# Patient Record
Sex: Male | Born: 1935 | Race: Black or African American | Hispanic: No | Marital: Married
Health system: Southern US, Community
[De-identification: ages and names within clinical notes are randomized; demographics above are authoritative.]

## PROBLEM LIST (undated history)

## (undated) DIAGNOSIS — G20 Parkinson's disease: Secondary | ICD-10-CM

## (undated) DIAGNOSIS — R296 Repeated falls: Secondary | ICD-10-CM

## (undated) DIAGNOSIS — I10 Essential (primary) hypertension: Secondary | ICD-10-CM

## (undated) HISTORY — DX: Repeated falls: R29.6

## (undated) HISTORY — PX: TONSILLECTOMY: SUR1361

## (undated) HISTORY — PX: INGUINAL HERNIA REPAIR: SUR1180

## (undated) HISTORY — DX: Parkinson's disease: G20

## (undated) HISTORY — PX: APPENDECTOMY: SHX54

---

## 2008-04-25 ENCOUNTER — Emergency Department (HOSPITAL_COMMUNITY): Admission: EM | Admit: 2008-04-25 | Discharge: 2008-04-25 | Payer: Self-pay | Admitting: Family Medicine

## 2008-11-05 ENCOUNTER — Encounter: Admission: RE | Admit: 2008-11-05 | Discharge: 2008-11-05 | Payer: Self-pay | Admitting: Neurology

## 2008-11-13 ENCOUNTER — Ambulatory Visit (HOSPITAL_COMMUNITY): Admission: RE | Admit: 2008-11-13 | Discharge: 2008-11-13 | Payer: Self-pay | Admitting: Neurology

## 2009-02-11 ENCOUNTER — Encounter: Admission: RE | Admit: 2009-02-11 | Discharge: 2009-02-11 | Payer: Self-pay | Admitting: Neurosurgery

## 2009-05-26 ENCOUNTER — Emergency Department (HOSPITAL_COMMUNITY): Admission: EM | Admit: 2009-05-26 | Discharge: 2009-05-26 | Payer: Self-pay | Admitting: Emergency Medicine

## 2009-11-04 ENCOUNTER — Inpatient Hospital Stay (HOSPITAL_COMMUNITY): Admission: EM | Admit: 2009-11-04 | Discharge: 2009-11-11 | Payer: Self-pay | Admitting: Emergency Medicine

## 2010-09-15 ENCOUNTER — Emergency Department (HOSPITAL_COMMUNITY): Payer: Medicare Other

## 2010-09-15 ENCOUNTER — Inpatient Hospital Stay (HOSPITAL_COMMUNITY)
Admission: EM | Admit: 2010-09-15 | Discharge: 2010-09-18 | DRG: 372 | Disposition: A | Discharge status: Home / Self Care | Payer: Medicare Other | Attending: Internal Medicine | Admitting: Internal Medicine

## 2010-09-15 DIAGNOSIS — N179 Acute kidney failure, unspecified: Secondary | ICD-10-CM | POA: Diagnosis present

## 2010-09-15 DIAGNOSIS — E860 Dehydration: Secondary | ICD-10-CM | POA: Diagnosis present

## 2010-09-15 DIAGNOSIS — G20 Parkinson's disease: Secondary | ICD-10-CM | POA: Diagnosis present

## 2010-09-15 DIAGNOSIS — G20A1 Parkinson's disease without dyskinesia, without mention of fluctuations: Secondary | ICD-10-CM | POA: Diagnosis present

## 2010-09-15 DIAGNOSIS — A0472 Enterocolitis due to Clostridium difficile, not specified as recurrent: Principal | ICD-10-CM | POA: Diagnosis present

## 2010-09-15 DIAGNOSIS — I10 Essential (primary) hypertension: Secondary | ICD-10-CM | POA: Diagnosis present

## 2010-09-15 DIAGNOSIS — Z7982 Long term (current) use of aspirin: Secondary | ICD-10-CM

## 2010-09-15 DIAGNOSIS — N400 Enlarged prostate without lower urinary tract symptoms: Secondary | ICD-10-CM | POA: Diagnosis present

## 2010-09-15 LAB — BASIC METABOLIC PANEL WITH GFR
Chloride: 98 meq/L (ref 96–112)
Glucose, Bld: 167 mg/dL — ABNORMAL HIGH (ref 70–99)
Sodium: 135 meq/L (ref 135–145)

## 2010-09-15 LAB — BASIC METABOLIC PANEL
BUN: 57 mg/dL — ABNORMAL HIGH (ref 6–23)
CO2: 28 mEq/L (ref 19–32)
Calcium: 8.9 mg/dL (ref 8.4–10.5)
Creatinine, Ser: 2.52 mg/dL — ABNORMAL HIGH (ref 0.4–1.5)
GFR calc Af Amer: 30 mL/min — ABNORMAL LOW (ref >60)
GFR calc non Af Amer: 25 mL/min — ABNORMAL LOW (ref >60)
Potassium: 3.8 mEq/L (ref 3.5–5.1)

## 2010-09-15 LAB — DIFFERENTIAL
Basophils Absolute: 0 10*3/uL (ref 0.0–0.1)
Basophils Relative: 0 % (ref 0–1)
Eosinophils Absolute: 0 10*3/uL (ref 0.0–0.7)
Eosinophils Relative: 0 % (ref 0–5)
Lymphocytes Relative: 5 % — ABNORMAL LOW (ref 12–46)
Lymphs Abs: 1 K/uL (ref 0.7–4.0)
Monocytes Absolute: 1.4 10*3/uL — ABNORMAL HIGH (ref 0.1–1.0)
Monocytes Relative: 7 % (ref 3–12)
Neutro Abs: 17.8 K/uL — ABNORMAL HIGH (ref 1.7–7.7)
Neutrophils Relative %: 88 % — ABNORMAL HIGH (ref 43–77)

## 2010-09-15 LAB — CBC
HCT: 31.5 % — ABNORMAL LOW (ref 39.0–52.0)
Hemoglobin: 10.9 g/dL — ABNORMAL LOW (ref 13.0–17.0)
MCH: 31.1 pg (ref 26.0–34.0)
MCHC: 34.6 g/dL (ref 30.0–36.0)
MCV: 89.7 fL (ref 78.0–100.0)
Platelets: 172 10*3/uL (ref 150–400)
RBC: 3.51 MIL/uL — ABNORMAL LOW (ref 4.22–5.81)
RDW: 13.5 % (ref 11.5–15.5)
WBC: 20.2 10*3/uL — ABNORMAL HIGH (ref 4.0–10.5)

## 2010-09-15 LAB — URINE MICROSCOPIC-ADD ON

## 2010-09-15 LAB — URINALYSIS, ROUTINE W REFLEX MICROSCOPIC
Bilirubin Urine: NEGATIVE
Nitrite: NEGATIVE
Protein, ur: 30 mg/dL — AB
Urobilinogen, UA: 1 mg/dL (ref 0.0–1.0)

## 2010-09-16 ENCOUNTER — Inpatient Hospital Stay (HOSPITAL_COMMUNITY): Payer: Medicare Other

## 2010-09-16 LAB — DIFFERENTIAL
Lymphocytes Relative: 3 % — ABNORMAL LOW (ref 12–46)
Monocytes Absolute: 1.9 10*3/uL — ABNORMAL HIGH (ref 0.1–1.0)
Monocytes Relative: 12 % (ref 3–12)
Neutrophils Relative %: 85 % — ABNORMAL HIGH (ref 43–77)

## 2010-09-16 LAB — BASIC METABOLIC PANEL
CO2: 29 mEq/L (ref 19–32)
Chloride: 100 mEq/L (ref 96–112)
Creatinine, Ser: 2.02 mg/dL — ABNORMAL HIGH (ref 0.4–1.5)
Sodium: 137 mEq/L (ref 135–145)

## 2010-09-16 LAB — CBC
HCT: 29.6 % — ABNORMAL LOW (ref 39.0–52.0)
MCHC: 33.4 g/dL (ref 30.0–36.0)
MCV: 88.4 fL (ref 78.0–100.0)
Platelets: 170 10*3/uL (ref 150–400)
RBC: 3.35 MIL/uL — ABNORMAL LOW (ref 4.22–5.81)
RDW: 13.5 % (ref 11.5–15.5)

## 2010-09-17 LAB — BASIC METABOLIC PANEL
CO2: 25 mEq/L (ref 19–32)
Calcium: 8.3 mg/dL — ABNORMAL LOW (ref 8.4–10.5)
Chloride: 103 mEq/L (ref 96–112)
Creatinine, Ser: 1.95 mg/dL — ABNORMAL HIGH (ref 0.4–1.5)
Creatinine, Ser: 1.85 mg/dL — ABNORMAL HIGH (ref 0.4–1.5)
GFR calc Af Amer: 40 mL/min — ABNORMAL LOW (ref >60)
GFR calc Af Amer: 43 mL/min — ABNORMAL LOW (ref >60)
Potassium: 3.5 mEq/L (ref 3.5–5.1)
Potassium: 3.5 mEq/L (ref 3.5–5.1)
Sodium: 136 mEq/L (ref 135–145)

## 2010-09-17 LAB — CBC
HCT: 30 % — ABNORMAL LOW (ref 39.0–52.0)
HCT: 31.8 % — ABNORMAL LOW (ref 39.0–52.0)
MCH: 31 pg (ref 26.0–34.0)
MCHC: 35 g/dL (ref 30.0–36.0)
MCV: 88.5 fL (ref 78.0–100.0)
MCV: 87.8 fL (ref 78.0–100.0)
Platelets: 169 10*3/uL (ref 150–400)
RBC: 3.62 MIL/uL — ABNORMAL LOW (ref 4.22–5.81)
RDW: 13.4 % (ref 11.5–15.5)
WBC: 12.6 10*3/uL — ABNORMAL HIGH (ref 4.0–10.5)

## 2010-09-19 LAB — STOOL CULTURE

## 2010-09-22 NOTE — H&P (Signed)
NAME:  Kevin Hendrix, Kevin Hendrix NO.:  [id number]  MEDICAL RECORD NO.:  [id number]           PATIENT TYPE:  E  LOCATION:  [facility]                         FACILITY:  [facility]  PHYSICIAN:  Erick Blinks, MD     DATE OF BIRTH:  1929-04-19  DATE OF ADMISSION:  09/15/2010 DATE OF DISCHARGE:                             HISTORY & PHYSICAL   PRIMARY CARE PHYSICIAN:  Candyce Churn, MD  CHIEF COMPLAINT:  Abdominal pain and diarrhea.  HISTORY OF PRESENT ILLNESS:  This is a 77 year old gentleman with history of hypertension and Parkinson disease, who presents to the emergency room with complaints of diarrhea.  The patient reports having diarrhea for the past 1 week or so.  He did have a fever of 101 which was reported on Friday and since then has not had any significant fever. He was having 4-5 bowel movements a day per his family and since then and it has somewhat improved.  He did not have any nausea or vomiting. He had some abdominal pain on palpation but otherwise does not have any pain.  He denies any dysuria or any shortness of breath.  His family reports that he does occasionally choke when he eats and has a cough with that, but no significant cough otherwise.  No headache, changes in vision, unilateral weakness or numbness.  He denies any chest pain or any other complaints.  In the ER, the patient had a CT scan done of his abdomen which was consistent with colitis.  He was also found to have a leukocytosis of 20,000 and acute renal failure.  He has been preferred for admission.  PAST MEDICAL HISTORY: 1. Hypertension. 2. Parkinson disease. 3. Benign prostatic hypertrophy.  ALLERGIES:  No known drug allergies.  MEDICATIONS:  Prior to admission. 1. Finasteride 5 mg daily. 2. Maxzide 37.5/25 mg daily. 3. Aspirin 81 mg daily. 4. Sinemet 25/250 mg half a tablet q.i.d.  SOCIAL HISTORY:  The patient is a nonsmoker, nondrinker.  Denies any drug use.  FAMILY HISTORY:   Hypertension and diabetes runs in his family.  REVIEW OF SYSTEMS:  As per HPI.  PHYSICAL EXAMINATION:  VITAL SIGNS:  Temperature of 98.9, respiratory rate of 20, heart rate of 83, blood pressure 132/57, oxygen saturation 98% on room air. GENERAL:  The patient is in no acute distress, lying in bed. HEENT:  Normocephalic, atraumatic.  Pupils are equal, round, reactive to light. NECK:  Supple. CHEST:  Clear to auscultation bilaterally. CARDIAC:  S1, S2 with a regular rate and rhythm. ABDOMEN:  Soft.  Mildly tender in the left lower quadrant.  Bowel sounds are active. EXTREMITIES:  No cyanosis, clubbing, or edema. NEUROLOGIC:  The patient is moving all of his extremities spontaneously. Strength is equal bilaterally.  Cranial nerves II-XII appeared grossly intact.  LABORATORY DATA:  Sodium 135, potassium 3.8, chloride 98, bicarb 28, glucose 167, BUN 57, creatinine 2.52, calcium 8.9, WBC 20.2, hemoglobin of 10.9, platelet count of 172.  Urinalysis shows 0-2 wbc's, negative leukocyte esterase, and negative nitrites.  CT of the abdomen and pelvis shows marked abnormality of the colon consistent with diffuse colitis. Differential diagnosis includes  infectious inflammatory cause, ischemic colitis will be less likely given the distribution, numerous hepatic and left renal cysts, and reduction and repair of bilateral inguinal hernias.  ASSESSMENT AND PLAN: 1. We will admit the patient to Dr. Kevan Ny' service.  He will be     admitted to a regular med/surg bed.  He will receive ciprofloxacin     and Flagyl for his colitis.  We will rehydrate with IV fluids.  We     will check stool for C. diff PCR as well as stool culture and ova,     parasites.  The patient will be given IV fluids.  We will recheck     his renal function in the morning.  If there is no significant     improvement, we will consider a renal ultrasound at that time.  The     patient will be placed on a heart healthy diet and  further orders     will be per the clinical course.  ADMISSION DIAGNOSES: 1. Acute colitis, presumed infectious 2. Acute renal failure. 3. Dehydration. 4. Diarrhea. 5. Hypertension. 6. Parkinson's. Further orders will be per the clinical course.     Erick Blinks, MD     JM/MEDQ  D:  09/15/2010  T:  09/15/2010  Job:  [id number]  cc:   Candyce Churn, M.D.  Electronically Signed by Erick Blinks  on 09/22/2010 10:15:52 PM

## 2010-09-24 NOTE — Discharge Summary (Signed)
  NAME:  Kevin Hendrix, Kevin Hendrix           ACCOUNT NO.:  [id number]  MEDICAL RECORD NO.:  [id number]           PATIENT TYPE:  I  LOCATION:  6733                         FACILITY:  [facility]  PHYSICIAN:  Candyce Churn, M.D.DATE OF BIRTH:  1929/01/28  DATE OF ADMISSION:  09/15/2010 DATE OF DISCHARGE:  09/18/2010                              DISCHARGE SUMMARY   DISCHARGE DIAGNOSES: 1. Clostridium difficile colitis, improving. 2. Parkinson disease. 3. Hypertension. 4. History of benign prostatic hypertrophy.  ALLERGIES:  No known drug allergies.  DISCHARGE MEDICATIONS: 1. Finasteride 5 mg daily. 2. Maxzide 37.5/25 mg daily. 3. Aspirin 81 mg daily. 4. Sinemet 25/250 one-half tablet q.i.d. 5. Flagyl 500 mg t.i.d. for 10 days.  DISCHARGE LABORATORIES:  Sodium 136, potassium 3.5, chloride 103, bicarb 25, glucose 140, BUN 44, creatinine 1.85, calcium 8.2.  White count 12,600, hemoglobin 11.1, and platelet count 173,000 on September 17, 2010.  CBC on admission revealed a white count of 20,200, hemoglobin 10.9, and platelet count 172,000 with 88% neutrophils.  HOSPITAL COURSE:  [name] is a very pleasant 77 year old male with hypertension and Parkinson disease who presented to the emergency room complaining of diarrhea and has had it for about a week. He did have a fever of 101 degrees on 1-2 days prior to admission.  He was having 4-5 bowel movements a day.  Apparently, he had taken some antibiotic therapy for dental work in the last several weeks.  The patient was admitted and stools were cultured and C. diff was positive and he was switched from IV Cipro and Flagyl to p.o. Flagyl on September 16, 2010.  The patient has tolerated these medications well.  Still slightly febrile on September 17, 2010, and plans are for discharge September 18, 2010.  Radiographic procedures while admitted included a CT of the abdomen and pelvis on September 15, 2010, revealing marked abnormality of the  colon consistent with diffuse colitis.  There were numerous hepatic and left renal cyst.  The patient had internal reduction and repair of bilateral inguinal hernias since his prior CT scanning in April 2011.  Chest x-ray from September 16, 2010, revealed old right rib fractures and degenerative changes of the right shoulder, but no active cardiopulmonary disease.  HOSPITAL COURSE:  The patient was improved markedly with introduction of antibiotics and by the time of discharge was able to ambulate with a walker and we will plan for home PT/OT and continued oral Flagyl as an outpatient.  We will plan to see back in the office in 2 weeks for followup and p.r.n.  CONDITION ON DISCHARGE:  Improved.  I should mention that his creatinine on admission was 2.52 and BUN 57 and on September 17, 2010, creatinine was down to 1.85 and BUN was 44.  Time spent reviewing chart, examining the patient, and performing dictation was 35 minutes.     Candyce Churn, M.D.     RNG/MEDQ  D:  09/17/2010  T:  09/18/2010  Job:  [id number]  Electronically Signed by Marden Noble M.D. on 09/24/2010 03:37:30 PM

## 2010-09-29 LAB — CBC
HCT: 35.8 % — ABNORMAL LOW (ref 39.0–52.0)
HCT: 41.3 % (ref 39.0–52.0)
Hemoglobin: 11.4 g/dL — ABNORMAL LOW (ref 13.0–17.0)
Hemoglobin: 12.3 g/dL — ABNORMAL LOW (ref 13.0–17.0)
MCHC: 34.3 g/dL (ref 30.0–36.0)
MCV: 96.6 fL (ref 78.0–100.0)
MCV: 97.3 fL (ref 78.0–100.0)
RBC: 3.7 MIL/uL — ABNORMAL LOW (ref 4.22–5.81)
RBC: 4.24 MIL/uL (ref 4.22–5.81)
RDW: 13.5 % (ref 11.5–15.5)
WBC: 4.3 10*3/uL (ref 4.0–10.5)
WBC: 8.4 10*3/uL (ref 4.0–10.5)

## 2010-09-29 LAB — DIFFERENTIAL
Basophils Absolute: 0 10*3/uL (ref 0.0–0.1)
Basophils Absolute: 0 10*3/uL (ref 0.0–0.1)
Lymphocytes Relative: 13 % (ref 12–46)
Lymphocytes Relative: 6 % — ABNORMAL LOW (ref 12–46)
Monocytes Absolute: 0.8 10*3/uL (ref 0.1–1.0)
Neutro Abs: 4.7 10*3/uL (ref 1.7–7.7)
Neutro Abs: 7.2 10*3/uL (ref 1.7–7.7)
Neutrophils Relative %: 86 % — ABNORMAL HIGH (ref 43–77)

## 2010-09-29 LAB — COMPREHENSIVE METABOLIC PANEL
BUN: 39 mg/dL — ABNORMAL HIGH (ref 6–23)
CO2: 34 mEq/L — ABNORMAL HIGH (ref 19–32)
Chloride: 103 mEq/L (ref 96–112)
Creatinine, Ser: 1.81 mg/dL — ABNORMAL HIGH (ref 0.4–1.5)
GFR calc non Af Amer: 36 mL/min — ABNORMAL LOW (ref >60)
Total Bilirubin: 1.2 mg/dL (ref 0.3–1.2)

## 2010-09-29 LAB — URINE CULTURE

## 2010-09-29 LAB — BASIC METABOLIC PANEL
BUN: 20 mg/dL (ref 6–23)
Calcium: 8.3 mg/dL — ABNORMAL LOW (ref 8.4–10.5)
Chloride: 105 mEq/L (ref 96–112)
GFR calc Af Amer: 53 mL/min — ABNORMAL LOW (ref >60)
GFR calc Af Amer: >60 mL/min (ref >60)
GFR calc non Af Amer: 44 mL/min — ABNORMAL LOW (ref >60)
Glucose, Bld: 113 mg/dL — ABNORMAL HIGH (ref 70–99)
Potassium: 3.8 mEq/L (ref 3.5–5.1)
Sodium: 141 mEq/L (ref 135–145)

## 2010-09-29 LAB — URINE MICROSCOPIC-ADD ON

## 2010-09-29 LAB — URINALYSIS, ROUTINE W REFLEX MICROSCOPIC
Nitrite: NEGATIVE
Specific Gravity, Urine: 1.028 (ref 1.005–1.030)
Urobilinogen, UA: 1 mg/dL (ref 0.0–1.0)

## 2010-09-29 LAB — LIPASE, BLOOD: Lipase: 23 U/L (ref 11–59)

## 2010-09-29 LAB — HEMOCCULT GUIAC POC 1CARD (OFFICE): Fecal Occult Bld: POSITIVE

## 2010-10-01 ENCOUNTER — Other Ambulatory Visit (HOSPITAL_COMMUNITY): Payer: Self-pay | Admitting: Internal Medicine

## 2010-10-06 ENCOUNTER — Ambulatory Visit (HOSPITAL_COMMUNITY)
Admission: RE | Admit: 2010-10-06 | Discharge: 2010-10-06 | Disposition: A | Discharge status: Home / Self Care | Payer: Medicare Other | Source: Ambulatory Visit | Attending: Internal Medicine | Admitting: Internal Medicine

## 2010-10-06 DIAGNOSIS — R1310 Dysphagia, unspecified: Secondary | ICD-10-CM | POA: Insufficient documentation

## 2011-02-22 ENCOUNTER — Other Ambulatory Visit: Payer: Self-pay | Admitting: Neurology

## 2011-02-22 DIAGNOSIS — D320 Benign neoplasm of cerebral meninges: Secondary | ICD-10-CM

## 2011-02-22 DIAGNOSIS — G20 Parkinson's disease: Secondary | ICD-10-CM

## 2011-02-22 DIAGNOSIS — R269 Unspecified abnormalities of gait and mobility: Secondary | ICD-10-CM

## 2011-03-01 ENCOUNTER — Other Ambulatory Visit: Payer: Medicare Other

## 2011-03-31 ENCOUNTER — Ambulatory Visit
Admission: RE | Admit: 2011-03-31 | Discharge: 2011-03-31 | Disposition: A | Discharge status: Home / Self Care | Payer: Medicare Other | Source: Ambulatory Visit | Attending: Neurology | Admitting: Neurology

## 2011-03-31 DIAGNOSIS — D320 Benign neoplasm of cerebral meninges: Secondary | ICD-10-CM

## 2011-03-31 DIAGNOSIS — G20 Parkinson's disease: Secondary | ICD-10-CM

## 2011-03-31 DIAGNOSIS — R269 Unspecified abnormalities of gait and mobility: Secondary | ICD-10-CM

## 2011-03-31 MED ORDER — GADOBENATE DIMEGLUMINE 529 MG/ML IV SOLN
13.0000 mL | Freq: Once | INTRAVENOUS | Status: AC | PRN
  Administered 2011-03-31: 13 mL via INTRAVENOUS

## 2011-09-27 ENCOUNTER — Emergency Department (HOSPITAL_COMMUNITY)
Admission: EM | Admit: 2011-09-27 | Discharge: 2011-09-27 | Disposition: A | Discharge status: Home / Self Care | Payer: Medicare Other | Attending: Emergency Medicine | Admitting: Emergency Medicine

## 2011-09-27 ENCOUNTER — Encounter (HOSPITAL_COMMUNITY): Payer: Self-pay | Admitting: Emergency Medicine

## 2011-09-27 DIAGNOSIS — I10 Essential (primary) hypertension: Secondary | ICD-10-CM | POA: Insufficient documentation

## 2011-09-27 DIAGNOSIS — G20 Parkinson's disease: Secondary | ICD-10-CM | POA: Insufficient documentation

## 2011-09-27 DIAGNOSIS — Z0000 Encounter for general adult medical examination without abnormal findings: Secondary | ICD-10-CM

## 2011-09-27 DIAGNOSIS — G20A1 Parkinson's disease without dyskinesia, without mention of fluctuations: Secondary | ICD-10-CM | POA: Insufficient documentation

## 2011-09-27 HISTORY — DX: Parkinson's disease: G20

## 2011-09-27 HISTORY — DX: Essential (primary) hypertension: I10

## 2011-09-27 NOTE — ED Provider Notes (Signed)
Medical screening examination/treatment/procedure(s) were conducted as a shared visit with non-physician practitioner(s) and myself.  I personally evaluated the patient during the encounter.  Blood pressures normalized. No chest pain or shortness of breath  Donnetta Hutching, MD 09/27/11 2353

## 2011-09-27 NOTE — ED Provider Notes (Signed)
History     CSN: [id number]  Arrival date & time 09/27/11  2027   First MD Initiated Contact with Patient 09/27/11 2214      Chief Complaint  Patient presents with  . Hypertension    (Consider location/radiation/quality/duration/timing/severity/associated sxs/prior treatment) HPI Comments: She states she feels fine has no visual disturbances, headache, nausea, vomiting, diarrhea, diaphoresis, chest pain, leg swelling.  His caregiver states that they took his blood pressure, and it was elevated in both arms.  He, states she's been taking his medication on a regular basis.  On arrival to the emergency room his blood pressure is slightly elevated, but well within normal limits.  Patient is a 78 y.o. male presenting with hypertension. The history is provided by the patient.  Hypertension The current episode started today. Pertinent negatives include no chest pain, congestion, fatigue, headaches or weakness.    Past Medical History  Diagnosis Date  . Hypertension   . Parkinson disease     Past Surgical History  Procedure Date  . Inguinal hernia repair   . Appendectomy   . Tonsillectomy     History reviewed. No pertinent family history.  History  Substance Use Topics  . Smoking status: Former Smoker    Quit date: 09/27/1974  . Smokeless tobacco: Not on file  . Alcohol Use: No      Review of Systems  Constitutional: Negative for fatigue.  HENT: Negative for congestion.   Eyes: Negative for visual disturbance.  Respiratory: Negative for shortness of breath.   Cardiovascular: Negative for chest pain.  Neurological: Negative for dizziness, weakness and headaches.  Psychiatric/Behavioral: Negative for confusion.    Allergies  Review of patient's allergies indicates no known allergies.  Home Medications   Current Outpatient Rx  Name Route Sig Dispense Refill  . CARBIDOPA-LEVODOPA 25-100 MG PO TABS Oral Take 0.5 tablets by mouth 4 (four) times daily. Pt takes this  medication at 8 am, 11 am, 4 pm, 7 pm    . FINASTERIDE 5 MG PO TABS Oral Take 5 mg by mouth daily.    Marland Kitchen OLMESARTAN MEDOXOMIL 20 MG PO TABS Oral Take 20 mg by mouth daily.      BP 141/67  Pulse 76  Temp(Src) 98.7 F (37.1 C) (Oral)  Resp 17  SpO2 96%  Physical Exam  Constitutional: He appears well-developed and well-nourished.  HENT:  Head: Normocephalic.  Neck: Normal range of motion.  Pulmonary/Chest: Effort normal.  Musculoskeletal: Normal range of motion.  Neurological: He is alert.  Skin: Skin is warm. No rash noted. No pallor.    ED Course  Procedures (including critical care time)  Labs Reviewed - No data to display No results found.   1. Normal physical exam       MDM   I've encouraged the patient to bring his home blood pressure cuff with his next doctor appointment to verify its accuracy as his blood pressure here was well within normal range and he is asymptomatic        Arman Filter, NP 09/27/11 2300

## 2011-09-27 NOTE — Discharge Instructions (Signed)
Normal Exam, Adult You were seen and examined today in our facility. Our caregiver found nothing wrong on the exam. If testing was done such as lab work or x-rays, they did not indicate enough wrong to suggest that treatment should be given. The caregiver then must decide after testing is finished if your concern is a physical problem or illness that needs treatment. Today no treatable problem was found. Even if reassurance was given, if you feel you are getting worse when you get home make sure you call back or return to our department. For the protection of your privacy, test results can not be given over the phone. Make sure you receive the results of your test. Ask as to how these results are to be obtained if you have not been informed. It is your responsibility to obtain your test results. Your condition can change over time. Sometimes it takes more than one visit to determine the cause of your symptoms. It is important that you monitor your condition for any changes. SEEK IMMEDIATE MEDICAL CARE IF:  You develop an oral temperature above 102 F (38.9 C), which lasts for more than 2 days, not controlled by medications. Only take over-the-counter or prescription medicines for pain, discomfort, or fever as directed by your caregiver.   You develop a loss of appetite or start throwing up (vomiting).   You develop a rash, cough, belly (abdominal) pain, earache, headache, or develop pain in neck, muscles, or joints.   The problem or problems which brought you to our facility become worse or are a cause of more concern.  If we have told you today your exam and tests are normal, and a short while later you feel this is not right, please seek medical attention so you may be rechecked. Document Released: 10/10/2000 Document Revised: 06/17/2011 Document Reviewed: 02/02/2008 [facility] Patient Information 2012 [place], [place]. Please make sure to bring a blood pressure cuff with you on your next visit to  see Dr. Kevan Ny to verify its accuracy

## 2011-09-27 NOTE — ED Notes (Signed)
Pt to ED for eval of htn, pt checked BP at home 203/92 in R arm and L arm 190/91 at 1200 then rechecked it at 1835 and r arm was 207/95 and l arm 205/91 called MD Dr Debby Bud and was told to come to ED; pt denies pain, lightheadedness, blurred/double vision; denies n/v; pt in NAD, A&OX4

## 2011-09-27 NOTE — ED Notes (Signed)
Pt seen and dispositioned by EDNP prior to RN assessment, see NP notes, orders received and initiated, pt denies sx, (denies: needs, sx, questions or concerns unmet), "ready to go", family present.

## 2012-03-23 ENCOUNTER — Other Ambulatory Visit: Payer: Self-pay | Admitting: Neurology

## 2012-03-23 DIAGNOSIS — D329 Benign neoplasm of meninges, unspecified: Secondary | ICD-10-CM

## 2012-03-30 ENCOUNTER — Other Ambulatory Visit: Payer: Medicare Other

## 2012-04-04 ENCOUNTER — Ambulatory Visit
Admission: RE | Admit: 2012-04-04 | Discharge: 2012-04-04 | Disposition: A | Discharge status: Home / Self Care | Payer: Medicare Other | Source: Ambulatory Visit | Attending: Neurology | Admitting: Neurology

## 2012-04-04 DIAGNOSIS — D329 Benign neoplasm of meninges, unspecified: Secondary | ICD-10-CM

## 2012-04-04 MED ORDER — GADOBENATE DIMEGLUMINE 529 MG/ML IV SOLN
14.0000 mL | Freq: Once | INTRAVENOUS | Status: AC | PRN
  Administered 2012-04-04: 14 mL via INTRAVENOUS

## 2012-07-19 IMAGING — CT CT ABD-PELV W/O CM
2 of 4 series · 17 of 46 positions shown, 19 images · non-contrast
Comparison: 11/04/2009

CLINICAL DATA: Abdominal and pelvic pain.  Persistent diarrhea.
Low grade fever.  History hypertension, Parkinson's, appendectomy,
laparotomy, hernia repair.  Elevated creatinine.

CT ABDOMEN AND PELVIS WITHOUT CONTRAST
TECHNIQUE: Multidetector CT imaging of the abdomen and pelvis was
performed following the standard protocol without intravenous
contrast.

[Series 2: abd/pelv w/o 5.0 b31f st · axial · non-contrast · 0.68mm/px · z∈[+885,+1245]mm · 14 of 79 slices shown, 16 images]
[im 4/79  soft-tissue]
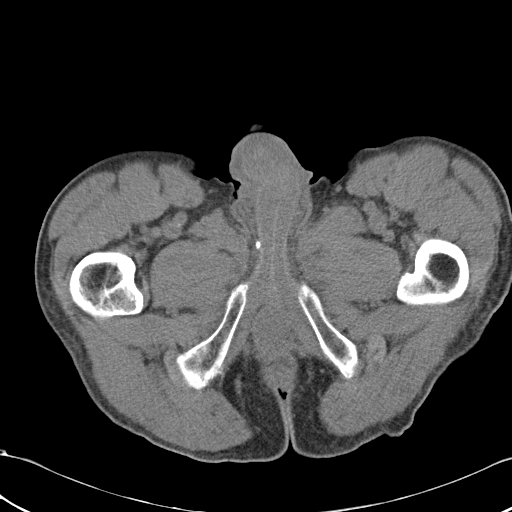
[im 4/79  bone]
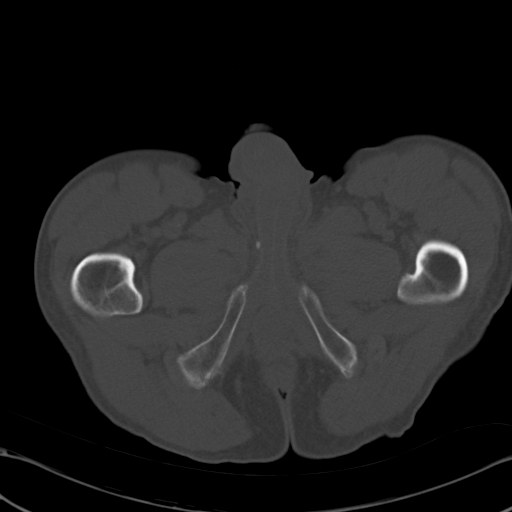
[im 10/79  soft-tissue]
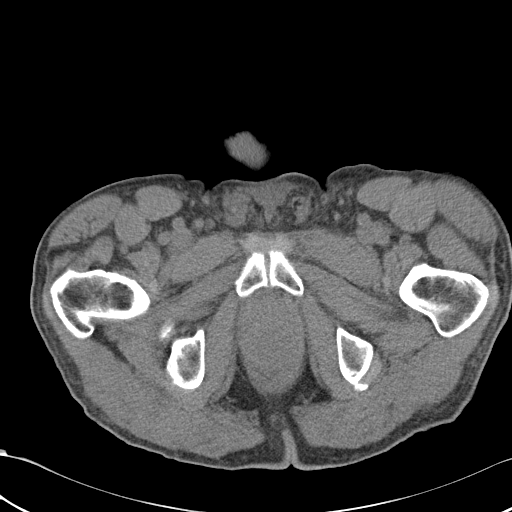
[im 16/79  soft-tissue]
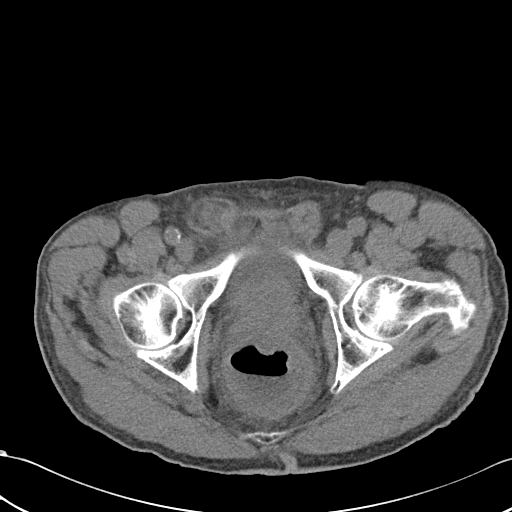
[im 22/79  soft-tissue]
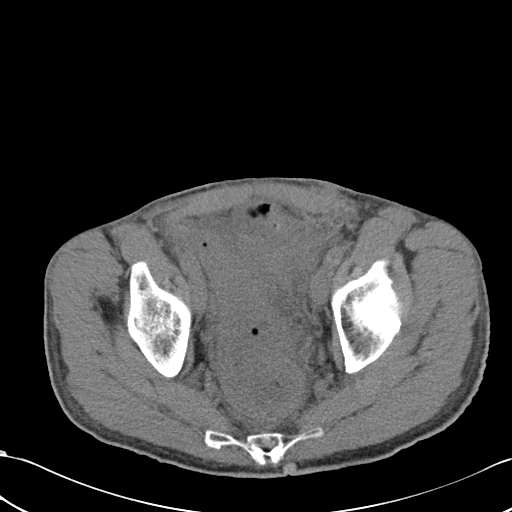
[im 28/79  soft-tissue]
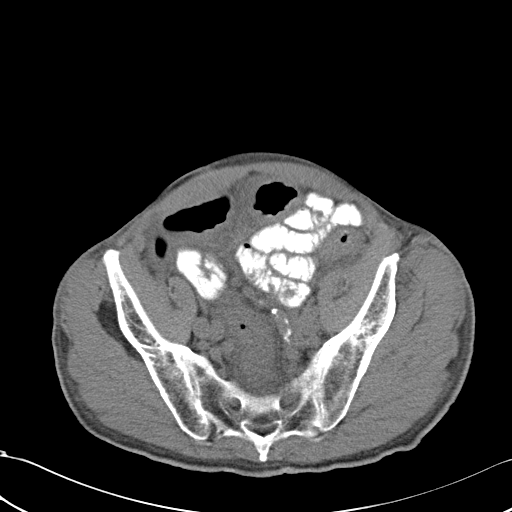
[im 31/79  soft-tissue]
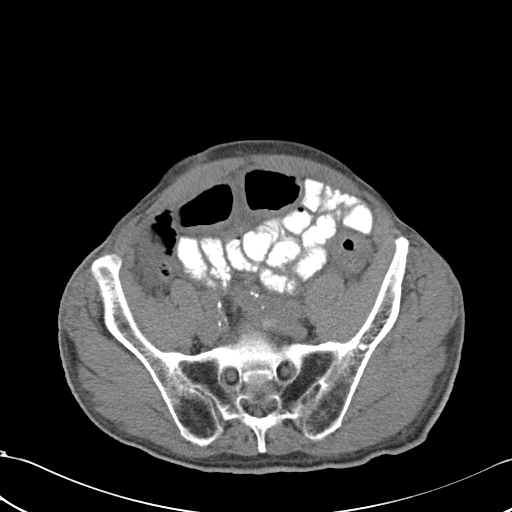
[im 37/79  soft-tissue]
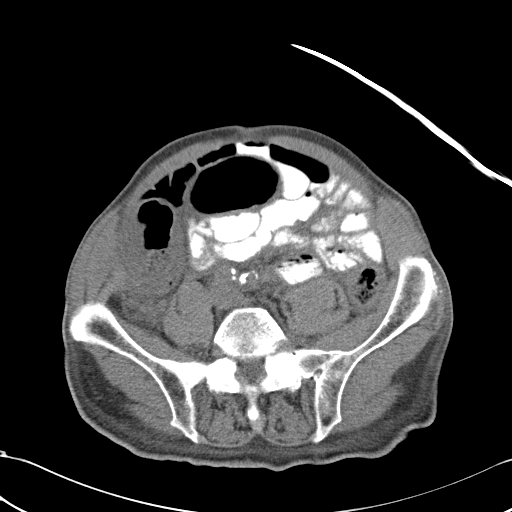
[im 43/79  soft-tissue]
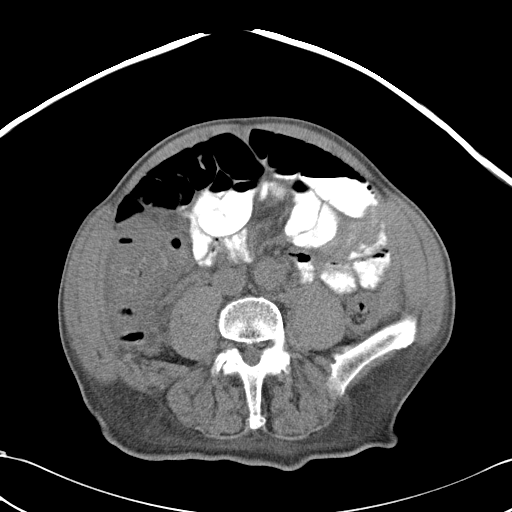
[im 49/79  soft-tissue]
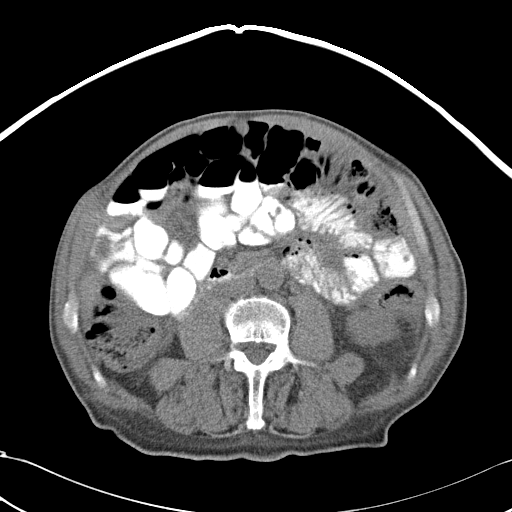
[im 49/79  bone]
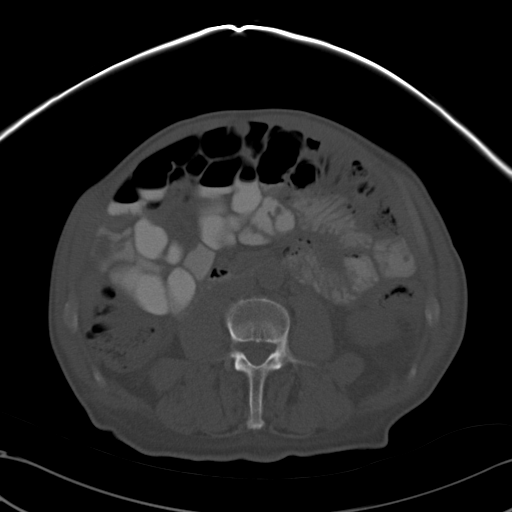
[im 52/79  soft-tissue]
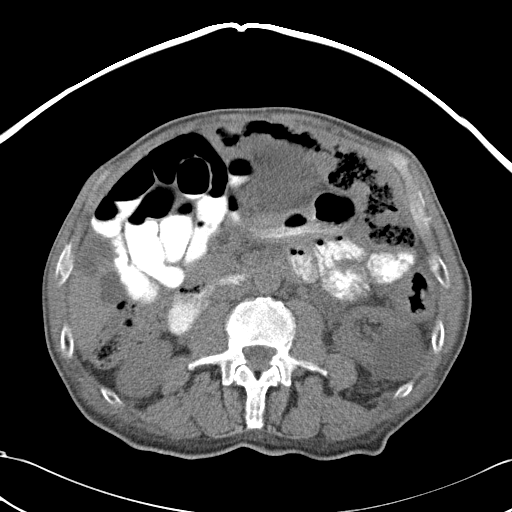
[im 58/79  soft-tissue]
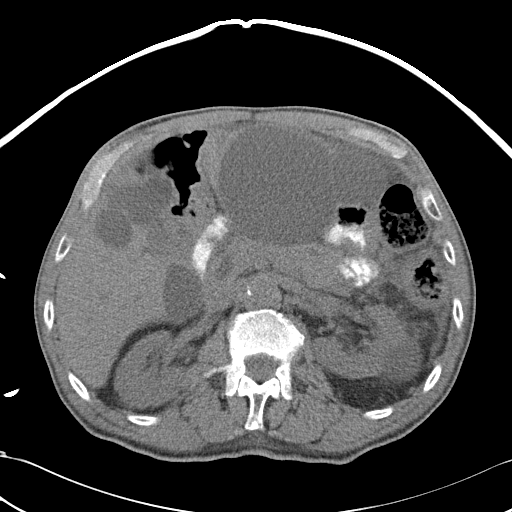
[im 64/79  soft-tissue]
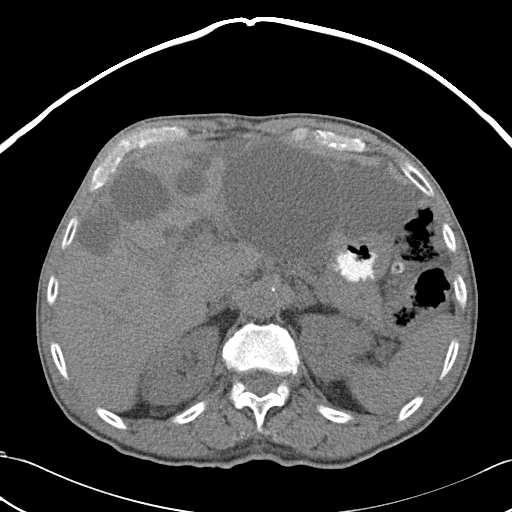
[im 70/79  soft-tissue]
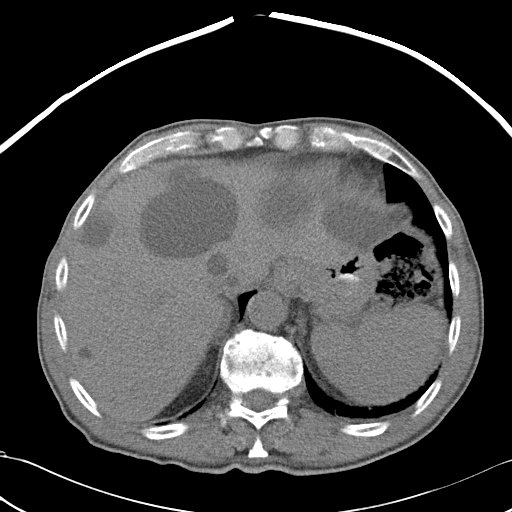
[im 76/79  soft-tissue]
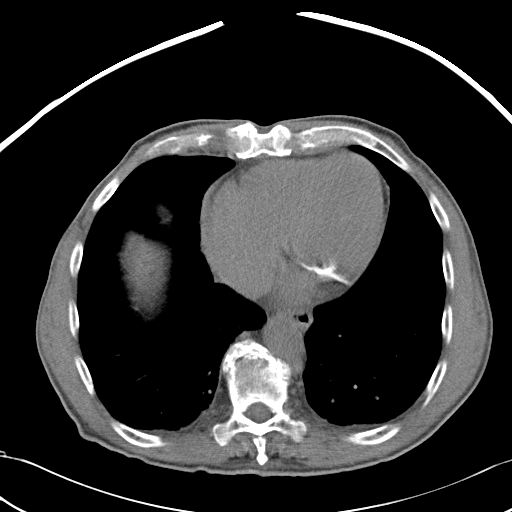

[Series 5: abd/pelv w/o 2.0 spo cor thins · coronal · non-contrast · 1.04mm/px · 3 of 108 slices shown]
[im 36/108  soft-tissue]
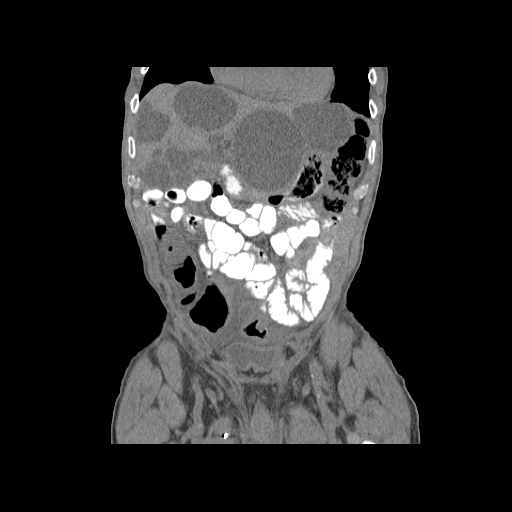
[im 48/108  soft-tissue]
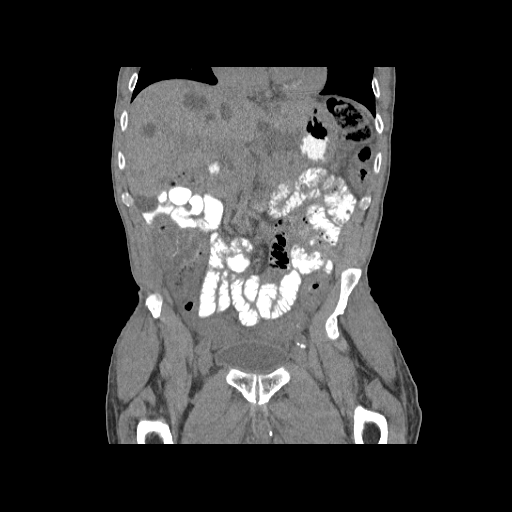
[im 60/108  soft-tissue]
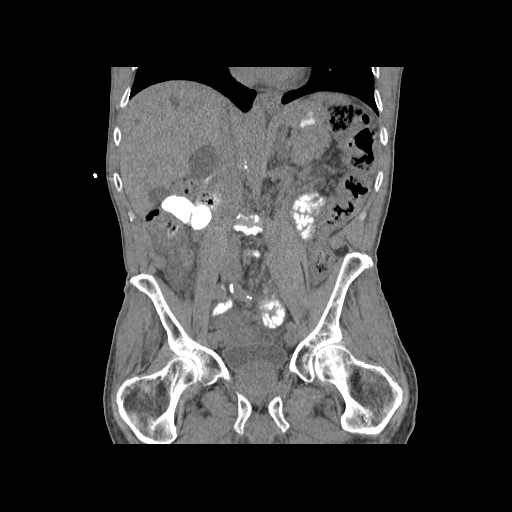

[17 of 46 positions shown; findings below may reference images not displayed]

FINDINGS: Again noted are numerous large and small cysts throughout
the liver.  The largest is within the left hepatic lobe, measuring
8.0 x 9.0 cm.  Left renal cyst is 4.5 cm.  No focal abnormality
identified within the spleen, pancreas, or right kidney.

Small bowel loops appear normal in caliber.  There is marked
abnormality of large bowel loops.  This appears involve the entire
colon.  The wall appears thickened, estimated to measure at least
1.4 cm in the region of the sigmoid colon.  No evidence for free
intraperitoneal air or abscess.  There is no free pelvic fluid. No
evidence for aortic aneurysm.  No retroperitoneal or mesenteric
adenopathy.  Inguinal hernias have been repaired or reduced.  There
are degenerative changes throughout the lower thoracic and lumbar
spine.
IMPRESSION: 1.  Marked abnormality of the colon, consistent with diffuse
colitis.  Differential diagnosis includes infectious, inflammatory
causes.  Ischemic colitis would be less likely given the
distribution.
2.  Numerous hepatic and left renal cysts.
3.  Interval reduction or repair of bilateral inguinal hernias.

## 2013-01-01 ENCOUNTER — Encounter: Payer: Self-pay | Admitting: Neurology

## 2013-02-23 ENCOUNTER — Encounter: Payer: Self-pay | Admitting: Neurology

## 2013-02-23 ENCOUNTER — Telehealth: Payer: Self-pay | Admitting: Neurology

## 2013-02-23 NOTE — Telephone Encounter (Signed)
Called patient and confirmed sooner appt.

## 2013-02-23 NOTE — Telephone Encounter (Signed)
Call and ask for Kevin Hendrix, pt had an apt today but didn't come till 2:45 thought that was his apt but it was at 11:30 needs to get in soon per Kevin Hendrix due to pt is doing a lot of falling. Thanks.

## 2013-03-01 ENCOUNTER — Ambulatory Visit (INDEPENDENT_AMBULATORY_CARE_PROVIDER_SITE_OTHER): Payer: PRIVATE HEALTH INSURANCE | Admitting: Neurology

## 2013-03-01 ENCOUNTER — Encounter: Payer: Self-pay | Admitting: Neurology

## 2013-03-01 VITALS — BP 164/77 | HR 65 | Temp 98.7°F | Ht 67.5 in | Wt 142.5 lb

## 2013-03-01 DIAGNOSIS — G20A1 Parkinson's disease without dyskinesia, without mention of fluctuations: Secondary | ICD-10-CM

## 2013-03-01 DIAGNOSIS — R296 Repeated falls: Secondary | ICD-10-CM

## 2013-03-01 DIAGNOSIS — R269 Unspecified abnormalities of gait and mobility: Secondary | ICD-10-CM

## 2013-03-01 DIAGNOSIS — D320 Benign neoplasm of cerebral meninges: Secondary | ICD-10-CM

## 2013-03-01 DIAGNOSIS — Z9181 History of falling: Secondary | ICD-10-CM

## 2013-03-01 DIAGNOSIS — D329 Benign neoplasm of meninges, unspecified: Secondary | ICD-10-CM

## 2013-03-01 DIAGNOSIS — R413 Other amnesia: Secondary | ICD-10-CM

## 2013-03-01 DIAGNOSIS — G20 Parkinson's disease: Secondary | ICD-10-CM

## 2013-03-01 HISTORY — DX: Parkinson's disease: G20

## 2013-03-01 HISTORY — DX: Repeated falls: R29.6

## 2013-03-01 NOTE — Patient Instructions (Addendum)
I think overall you are doing fairly well and are stable at this point.   I do have some generic suggestions for you today:  Please make sure that you drink plenty of fluids. I would like for you to exercise daily for example in the form of walking 20-30 minutes every day, if you can. Please keep a regular sleep-wake schedule, keep regular meal times, do not skip any meals, eat  healthy snacks in between meals, such as fruit or nuts. Try to eat protein with every meal.   As far as your medications are concerned, I would like to suggest: no changes.   Engage in social activities in your community and with your family and try to keep up with current events by reading the newspaper or watching the news.  I do not think we need to make any changes in your medications at this point. I would like for you to use your 4 pronged cane at all times and sure 2 wheeled walker. Try to change positions slowly and always try to get her bearings before you start walking. I would not recommend that you use your single-point cane at this moment. As a reminder, we should probably consider doing another brain MRI a year from now. Your brain scans have been stable since 2010 which is a good sign. If you have sudden increase in lethargy or headache we have to recheck your brain tumor sooner. If you have another seizure-like episode he need to go to the emergency room or call 911.   Please remember, common seizure triggers are: Sleep deprivation, dehydration, overheating, stress, hypoglycemia or skipping meals, certain medications or excessive alcohol use, especially stopping alcohol abruptly if you have had heavy alcohol use before (aka alcohol withdrawal seizure). If you have a prolonged seizure over 2-5 minutes or back to back seizures, call or have someone call 911 or take you to the nearest emergency room. You cannot drive a car. Please do not swim alone or take a tub bath for safety. Do not cook with large quantities of  boiling water or oil for safety. Take your medicine for seizure prevention regularly and do not skip doses or stop medication abruptly and tone are told to do so by your healthcare provider. Try to get a refill on your antiepileptic medication ahead of time, so you are not at risk of running out. If you run out of the seizure medication and do not have a refill at hand she may run into medication withdrawal seizures.  Brett Canales is my clinical assistant and will answer any of your questions and relay your messages to me and will give you my messages.   Our phone number is [phone]. We also have an after hours call service for urgent matters and there is a physician on-call for urgent questions. For any emergencies you know to call 911 or go to the nearest emergency room.
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
## 2013-03-07 ENCOUNTER — Encounter (HOSPITAL_COMMUNITY): Payer: Self-pay | Admitting: Emergency Medicine

## 2013-03-07 ENCOUNTER — Emergency Department (HOSPITAL_COMMUNITY): Payer: Medicare Other

## 2013-03-07 ENCOUNTER — Other Ambulatory Visit: Payer: Self-pay

## 2013-03-07 ENCOUNTER — Inpatient Hospital Stay (HOSPITAL_COMMUNITY)
Admission: EM | Admit: 2013-03-07 | Discharge: 2013-03-14 | DRG: 481 | Disposition: A | Discharge status: Skilled Nursing Facility | Payer: Medicare Other | Attending: Internal Medicine | Admitting: Internal Medicine

## 2013-03-07 DIAGNOSIS — Y92009 Unspecified place in unspecified non-institutional (private) residence as the place of occurrence of the external cause: Secondary | ICD-10-CM

## 2013-03-07 DIAGNOSIS — Z9089 Acquired absence of other organs: Secondary | ICD-10-CM

## 2013-03-07 DIAGNOSIS — S72143A Displaced intertrochanteric fracture of unspecified femur, initial encounter for closed fracture: Principal | ICD-10-CM | POA: Diagnosis present

## 2013-03-07 DIAGNOSIS — Z9181 History of falling: Secondary | ICD-10-CM

## 2013-03-07 DIAGNOSIS — S72001R Fracture of unspecified part of neck of right femur, subsequent encounter for open fracture type IIIA, IIIB, or IIIC with malunion: Secondary | ICD-10-CM

## 2013-03-07 DIAGNOSIS — W19XXXA Unspecified fall, initial encounter: Secondary | ICD-10-CM | POA: Diagnosis present

## 2013-03-07 DIAGNOSIS — N400 Enlarged prostate without lower urinary tract symptoms: Secondary | ICD-10-CM | POA: Diagnosis present

## 2013-03-07 DIAGNOSIS — Z87891 Personal history of nicotine dependence: Secondary | ICD-10-CM

## 2013-03-07 DIAGNOSIS — G20A1 Parkinson's disease without dyskinesia, without mention of fluctuations: Secondary | ICD-10-CM | POA: Diagnosis present

## 2013-03-07 DIAGNOSIS — S72001A Fracture of unspecified part of neck of right femur, initial encounter for closed fracture: Secondary | ICD-10-CM

## 2013-03-07 DIAGNOSIS — S72009A Fracture of unspecified part of neck of unspecified femur, initial encounter for closed fracture: Secondary | ICD-10-CM

## 2013-03-07 DIAGNOSIS — G20 Parkinson's disease: Secondary | ICD-10-CM

## 2013-03-07 DIAGNOSIS — I10 Essential (primary) hypertension: Secondary | ICD-10-CM | POA: Diagnosis present

## 2013-03-07 DIAGNOSIS — D649 Anemia, unspecified: Secondary | ICD-10-CM | POA: Diagnosis present

## 2013-03-07 DIAGNOSIS — D696 Thrombocytopenia, unspecified: Secondary | ICD-10-CM | POA: Diagnosis present

## 2013-03-07 DIAGNOSIS — Z833 Family history of diabetes mellitus: Secondary | ICD-10-CM

## 2013-03-07 DIAGNOSIS — D62 Acute posthemorrhagic anemia: Secondary | ICD-10-CM | POA: Diagnosis not present

## 2013-03-07 LAB — COMPREHENSIVE METABOLIC PANEL
ALT: <5 U/L (ref 0–53)
AST: 16 U/L (ref 0–37)
Albumin: 3.6 g/dL (ref 3.5–5.2)
Alkaline Phosphatase: 67 U/L (ref 39–117)
BUN: 21 mg/dL (ref 6–23)
Potassium: 3.9 mEq/L (ref 3.5–5.1)
Sodium: 139 mEq/L (ref 135–145)
Total Protein: 7 g/dL (ref 6.0–8.3)

## 2013-03-07 LAB — CBC WITH DIFFERENTIAL/PLATELET
Basophils Absolute: 0 10*3/uL (ref 0.0–0.1)
Basophils Relative: 0 % (ref 0–1)
Eosinophils Absolute: 0.1 10*3/uL (ref 0.0–0.7)
MCH: 31.1 pg (ref 26.0–34.0)
MCHC: 34.2 g/dL (ref 30.0–36.0)
Neutrophils Relative %: 76 % (ref 43–77)
Platelets: 135 10*3/uL — ABNORMAL LOW (ref 150–400)

## 2013-03-07 LAB — URINALYSIS, ROUTINE W REFLEX MICROSCOPIC
Bilirubin Urine: NEGATIVE
Nitrite: NEGATIVE
Specific Gravity, Urine: 1.026 (ref 1.005–1.030)
Urobilinogen, UA: 1 mg/dL (ref 0.0–1.0)

## 2013-03-07 LAB — TYPE AND SCREEN: Antibody Screen: NEGATIVE

## 2013-03-07 LAB — PROTIME-INR: Prothrombin Time: 14 seconds (ref 11.6–15.2)

## 2013-03-07 MED ORDER — MORPHINE SULFATE 2 MG/ML IJ SOLN
0.5000 mg | INTRAMUSCULAR | Status: DC | PRN
  Administered 2013-03-08 (×2): 0.5 mg via INTRAVENOUS
  Filled 2013-03-07 (×2): qty 1

## 2013-03-07 MED ORDER — FENTANYL CITRATE 0.05 MG/ML IJ SOLN: 50.0000 ug | Freq: Once | INTRAMUSCULAR | Status: DC

## 2013-03-07 MED ORDER — CARBIDOPA-LEVODOPA 25-100 MG PO TABS
0.5000 | ORAL_TABLET | Freq: Two times a day (BID) | ORAL | Status: DC
  Administered 2013-03-08 – 2013-03-14 (×12): 0.5 via ORAL
  Filled 2013-03-07 (×16): qty 0.5

## 2013-03-07 MED ORDER — IRBESARTAN 150 MG PO TABS
150.0000 mg | ORAL_TABLET | Freq: Every day | ORAL | Status: DC
  Administered 2013-03-08 – 2013-03-14 (×6): 150 mg via ORAL
  Filled 2013-03-07 (×7): qty 1

## 2013-03-07 MED ORDER — HYDRALAZINE HCL 20 MG/ML IJ SOLN
10.0000 mg | INTRAMUSCULAR | Status: DC | PRN
  Administered 2013-03-08: 10 mg via INTRAVENOUS
  Filled 2013-03-07: qty 1

## 2013-03-07 MED ORDER — PNEUMOCOCCAL VAC POLYVALENT 25 MCG/0.5ML IJ INJ
0.5000 mL | INJECTION | INTRAMUSCULAR | Status: AC
  Administered 2013-03-08: 0.5 mL via INTRAMUSCULAR
  Filled 2013-03-07: qty 0.5

## 2013-03-07 MED ORDER — FENTANYL CITRATE 0.05 MG/ML IJ SOLN
25.0000 ug | Freq: Once | INTRAMUSCULAR | Status: AC
  Administered 2013-03-07: 25 ug via INTRAVENOUS
  Filled 2013-03-07: qty 2

## 2013-03-07 MED ORDER — AMLODIPINE BESYLATE 5 MG PO TABS
5.0000 mg | ORAL_TABLET | Freq: Every day | ORAL | Status: DC
  Administered 2013-03-08 – 2013-03-14 (×6): 5 mg via ORAL
  Filled 2013-03-07 (×7): qty 1

## 2013-03-07 MED ORDER — FENTANYL CITRATE 0.05 MG/ML IJ SOLN: 100.0000 ug | Freq: Once | INTRAMUSCULAR | Status: DC

## 2013-03-07 MED ORDER — CARBIDOPA-LEVODOPA 25-100 MG PO TABS
0.5000 | ORAL_TABLET | Freq: Four times a day (QID) | ORAL | Status: DC
  Filled 2013-03-07: qty 1

## 2013-03-07 MED ORDER — HYDROCODONE-ACETAMINOPHEN 5-325 MG PO TABS
1.0000 | ORAL_TABLET | Freq: Four times a day (QID) | ORAL | Status: DC | PRN
  Administered 2013-03-08: 1 via ORAL
  Filled 2013-03-07 (×3): qty 2
  Filled 2013-03-07 (×2): qty 1
  Filled 2013-03-07: qty 2

## 2013-03-07 MED ORDER — FINASTERIDE 5 MG PO TABS
5.0000 mg | ORAL_TABLET | Freq: Every day | ORAL | Status: DC
  Administered 2013-03-08 – 2013-03-14 (×7): 5 mg via ORAL
  Filled 2013-03-07 (×7): qty 1

## 2013-03-07 MED ORDER — CARBIDOPA-LEVODOPA 25-100 MG PO TABS
1.0000 | ORAL_TABLET | Freq: Two times a day (BID) | ORAL | Status: DC
  Administered 2013-03-08 – 2013-03-14 (×13): 1 via ORAL
  Filled 2013-03-07 (×15): qty 1

## 2013-03-07 MED ORDER — AMLODIPINE BESYLATE 5 MG PO TABS
5.0000 mg | ORAL_TABLET | Freq: Once | ORAL | Status: AC
  Administered 2013-03-07: 5 mg via ORAL
  Filled 2013-03-07: qty 1

## 2013-03-07 MED ORDER — SODIUM CHLORIDE 0.9 % IV SOLN
INTRAVENOUS | Status: AC
  Administered 2013-03-07: 20 mL/h via INTRAVENOUS

## 2013-03-07 NOTE — ED Provider Notes (Signed)
CSN: [id number]     Arrival date & time 03/07/13  1900 History   First MD Initiated Contact with Patient 03/07/13 1903     Chief Complaint  Patient presents with  . Fall    fell on right hip   (Consider location/radiation/quality/duration/timing/severity/associated sxs/prior Treatment) The history is provided by the patient.   79 year old male with history of Parkinson's disease and hypertension presents with mechanical fall from standing with right hip pain. Patient reports he was trying to get his cane and tripped and fell landing on his right hip. Reports he tried to stand, however appearing weight was painful and she did not attempt to walk. Denies any LOC, head impact, neck pain, blood thinner use other than aspirin. Denies any numbness, tingling. He reports that this time that pain in his hip is not present at rest, however moderate to severe with movement.   Past Medical History  Diagnosis Date  . Hypertension   . Parkinson disease   . Recurrent falls 03/01/2013  . Parkinson's disease 03/01/2013   Past Surgical History  Procedure Laterality Date  . Inguinal hernia repair    . Appendectomy    . Tonsillectomy     Family History  Problem Relation Age of Onset  . Diabetes Mother   . Diabetes Father    History  Substance Use Topics  . Smoking status: Former Smoker    Types: Cigarettes    Quit date: 09/27/1974  . Smokeless tobacco: Never Used  . Alcohol Use: No    Review of Systems  Constitutional: Negative for fever.  HENT: Negative for sore throat, neck pain and neck stiffness.   Eyes: Negative for visual disturbance.  Respiratory: Positive for shortness of breath (mild chronic).   Cardiovascular: Negative for chest pain.  Gastrointestinal: Negative for abdominal pain.  Genitourinary: Negative for difficulty urinating.  Musculoskeletal: Positive for arthralgias and gait problem. Negative for back pain.  Skin: Negative for rash.  Neurological: Negative for syncope,  weakness, numbness and headaches.    Allergies  Review of patient's allergies indicates no known allergies.  Home Medications   No current outpatient prescriptions on file. BP 163/87  Pulse 78  Temp(Src) 98.6 F (37 C) (Oral)  Resp 18  Ht 5\' 6"  (1.676 m)  Wt 196 lb 3.4 oz (89 kg)  BMI 31.68 kg/m2  SpO2 98% Physical Exam  Nursing note and vitals reviewed. Constitutional: He is oriented to person, place, and time. He appears well-developed. No distress.  Thin, Parkinson's  HENT:  Head: Normocephalic and atraumatic.  Mouth/Throat: Oropharynx is clear and moist.  Eyes: Conjunctivae and EOM are normal.  Neck: Normal range of motion.  Cardiovascular: Normal rate, regular rhythm, normal heart sounds and intact distal pulses.  Exam reveals no gallop and no friction rub.   No murmur heard. Pulmonary/Chest: Effort normal and breath sounds normal. No respiratory distress. He has no wheezes. He has no rales.  Abdominal: Soft. He exhibits no distension. There is no tenderness. There is no guarding.  Musculoskeletal: He exhibits edema.       Right hip: He exhibits decreased range of motion, tenderness and bony tenderness. He exhibits no deformity and no laceration.       Left hip: He exhibits no bony tenderness.       Right knee: He exhibits no swelling and no effusion. No tenderness found.       Left knee: He exhibits no deformity.       Right ankle: He exhibits  swelling.       Left ankle: He exhibits swelling (2+ edema bilaterally). He exhibits normal pulse. Lacerations: healing wounds.       Cervical back: He exhibits no tenderness and no bony tenderness.  Neurological: He is alert and oriented to person, place, and time. No sensory deficit. GCS eye subscore is 4. GCS verbal subscore is 5. GCS motor subscore is 6.  Skin: Skin is warm and dry. He is not diaphoretic.    ED Course  Procedures (including critical care time) Labs Review Labs Reviewed  CBC WITH DIFFERENTIAL - Abnormal;  Notable for the following:    RBC 3.50 (*)    Hemoglobin 10.9 (*)    HCT 31.9 (*)    Platelets 135 (*)    All other components within normal limits  COMPREHENSIVE METABOLIC PANEL - Abnormal; Notable for the following:    Glucose, Bld 115 (*)    GFR calc non Af Amer 48 (*)    GFR calc Af Amer 55 (*)    All other components within normal limits  SURGICAL PCR SCREEN  PROTIME-INR  URINALYSIS, ROUTINE W REFLEX MICROSCOPIC  CBC  BASIC METABOLIC PANEL  VITAMIN D 25 HYDROXY  TYPE AND SCREEN  ABO/RH   Imaging Review Dg Chest 2 View  03/07/2013   *RADIOLOGY REPORT*  Clinical Data: Shortness of breath  CHEST - 2 VIEW  Comparison: 06/29/2012  Findings: The cardiac shadow is stable.  The lungs are clear bilaterally.  No pneumothorax is seen.  No acute bony abnormality is noted.  Degenerative changes of the shoulder joints are seen.  IMPRESSION: No acute abnormality noted.   Original Report Authenticated By: Alcide Clever, M.D.   Dg Hip Complete Right  03/07/2013   *RADIOLOGY REPORT*  Clinical Data: Pain.  RIGHT HIP - COMPLETE 2+ VIEW  Comparison: None.  Findings: There is evidence of acute intertrochanteric fracture of the proximal right femur.  The lesser trochanter is displaced by approximately 7 mm.  The rest the fractures shows fairly mild displacement but does appear extend up through the level of the greater trochanter.  IMPRESSION: Acute intertrochanteric fracture of the hip with relatively mild displacement.   Original Report Authenticated By: Irish Lack, M.D.    Date: 03/08/2013  Rate: 74  Rhythm: normal sinus rhythm  QRS Axis: normal  Intervals: normal  ST/T Wave abnormalities: nonspecific ST changes, T wave inversions III, aVF  Conduction Disutrbances:none  Narrative Interpretation:   Old EKG Reviewed: none available   MDM   1. HTN (hypertension)   2. Parkinson's disease   3. Hip fracture, right, closed, initial encounter    79 year old male with history of Parkinson's  disease and hypertension presents with mechanical fall from standing with right hip pain. Patient denies loss of consciousness, head impact, headache or neck pain and is not on anticoagulants other than aspirin.  Patient neurovascularly intact with concern of right hip pain. X-ray of the right hip shows acute inter-trochanteric fracture with mild displacement. Orthopedics was consulted and evaluated patient.  Patient was given fentanyl for pain.  Labs and chest x-ray ordered as part of presurgical screen. Patient will be admitted to the hospitalist service with likely surgical repair in the morning. Patient mildly hypertensive, however asymptomatic and given home dose of amlodipine.  Patient admitted in stable condition.  Rhae Lerner, MD 03/08/13 0157

## 2013-03-07 NOTE — H&P (Signed)
[facility] History and Physical  Kevin Hendrix ZOX:[id number] DOB: January 06, 1929 DOA: 03/07/2013  Referring physician: ER physician. PCP: Pearla Dubonnet, MD   Chief Complaint: Fall with right hip pain.  HPI: Kevin Hendrix is a 79 y.o. male with history of hypertension and Parkinson's disease had a fall at his house. Patient states that while trying to walk he lost his balance and fell. Denies any loss of consciousness or hitting his head. Denies any chest pain or shortness of breath. In the ER x-rays revealed right hip fracture and orthopedic consult was obtained and patient will be admitted for further management. Patient at this time is not in acute distress.  Review of Systems: As presented in the history of presenting illness, rest negative.  Past Medical History  Diagnosis Date  . Hypertension   . Parkinson disease   . Recurrent falls 03/01/2013  . Parkinson's disease 03/01/2013   Past Surgical History  Procedure Laterality Date  . Inguinal hernia repair    . Appendectomy    . Tonsillectomy     Social History:  reports that he quit smoking about 38 years ago. His smoking use included Cigarettes. He smoked 0.00 packs per day. He has never used smokeless tobacco. He reports that he does not drink alcohol or use illicit drugs. Home. where does patient live-- Can do ADLs. Can patient participate in ADLs?  No Known Allergies  Family History  Problem Relation Age of Onset  . Diabetes Mother   . Diabetes Father       Prior to Admission medications   Medication Sig Start Date End Date Taking? Authorizing Provider  AMLODIPINE BESYLATE PO Take 5 mg by mouth daily.   Yes Historical Provider, MD  aspirin 81 MG tablet Take 81 mg by mouth daily.   Yes Historical Provider, MD  carbidopa-levodopa (SINEMET) 25-100 MG per tablet Take 0.5-1 tablets by mouth 4 (four) times daily. Pt takes this medication at 1 tablet at 8 am, 0.5 tablet at 11 am,  1 tablet at 4 pm,  0.5  tablet at 7 pm   Yes Historical Provider, MD  finasteride (PROSCAR) 5 MG tablet Take 5 mg by mouth daily.   Yes Historical Provider, MD  furosemide (LASIX) 40 MG tablet Take 40 mg by mouth daily.    Yes Historical Provider, MD  olmesartan (BENICAR) 20 MG tablet Take 20 mg by mouth daily.   Yes Historical Provider, MD  potassium chloride SA (K-DUR,KLOR-CON) 20 MEQ tablet Take 20 mEq by mouth as needed (leg swelling).   Yes Historical Provider, MD   Physical Exam: Filed Vitals:   03/07/13 1930 03/07/13 2015 03/07/13 2030 03/07/13 2045  BP: 168/90 202/95 183/96 186/85  Pulse: 72 73 72 71  Temp:  98.2 F (36.8 C)    TempSrc:  Oral    Resp: 26 30 26 26   SpO2: 100% 97% 100% 99%     General:  Well-developed and nourished.  Eyes: Anicteric no pallor.  ENT: No discharge from the ears eyes nose mouth.  Neck: No mass felt.  Cardiovascular: S1-S2 heard.  Respiratory: No rhonchi or crepitations.  Abdomen: Soft nontender bowel sounds present.  Skin: No rash.  Musculoskeletal: Pain on moving right hip.  Psychiatric: Appears normal.  Neurologic: Alert awake oriented to time place and person. Moves all extremities.  Labs on Admission:  Basic Metabolic Panel: No results found for this basename: NA, K, CL, CO2, GLUCOSE, BUN, CREATININE, CALCIUM, MG, PHOS,  in the last 168 hours Liver Function  Tests: No results found for this basename: AST, ALT, ALKPHOS, BILITOT, PROT, ALBUMIN,  in the last 168 hours No results found for this basename: LIPASE, AMYLASE,  in the last 168 hours No results found for this basename: AMMONIA,  in the last 168 hours CBC:  Recent Labs Lab 03/07/13 2123  WBC 5.6  NEUTROABS 4.2  HGB 10.9*  HCT 31.9*  MCV 91.1  PLT 135*   Cardiac Enzymes: No results found for this basename: CKTOTAL, CKMB, CKMBINDEX, TROPONINI,  in the last 168 hours  BNP (last 3 results) No results found for this basename: PROBNP,  in the last 8760 hours CBG: No results found for  this basename: GLUCAP,  in the last 168 hours  Radiological Exams on Admission: Dg Chest 2 View  03/07/2013   *RADIOLOGY REPORT*  Clinical Data: Shortness of breath  CHEST - 2 VIEW  Comparison: 06/29/2012  Findings: The cardiac shadow is stable.  The lungs are clear bilaterally.  No pneumothorax is seen.  No acute bony abnormality is noted.  Degenerative changes of the shoulder joints are seen.  IMPRESSION: No acute abnormality noted.   Original Report Authenticated By: Alcide Clever, M.D.   Dg Hip Complete Right  03/07/2013   *RADIOLOGY REPORT*  Clinical Data: Pain.  RIGHT HIP - COMPLETE 2+ VIEW  Comparison: None.  Findings: There is evidence of acute intertrochanteric fracture of the proximal right femur.  The lesser trochanter is displaced by approximately 7 mm.  The rest the fractures shows fairly mild displacement but does appear extend up through the level of the greater trochanter.  IMPRESSION: Acute intertrochanteric fracture of the hip with relatively mild displacement.   Original Report Authenticated By: Irish Lack, M.D.    EKG: Independently reviewed. Normal sinus rhythm with nonspecific T-wave changes.  Assessment/Plan Principal Problem:   Hip fracture Active Problems:   Parkinson's disease   HTN (hypertension)   Anemia   1. Right hip fracture status post mechanical fall - from medical standpoint a few patient does not have any chest pain or short of breath and is not in acute distress and looks stable for surgery. Patient has been placed n.p.o. past midnight in anticipation of possible surgery. Continue with pain relief medications. Further recommendations per orthopedic surgery. 2. Hypertension - was mildly uncontrolled in the ER. Probably secondary to pain. Patient's home medications will be continued except for Lasix. Lasix to be continued after surgery once stable. When necessary IV hydralazine for systolic blood pressure more than 160. 3. Anemia and mild thrombocytopenia -  follow CBC closely. 4. Parkinson's disease - continue home medications.    Code Status: Full code.  Family Communication: Patient's family at the bedside.  Disposition Plan: Admit to inpatient under Dr. Marden Noble.    Jo Cerone N. [facility] Pager [phone].  If 7PM-7AM, please contact night-coverage [email or web address] Password [facility] 03/07/2013, 9:44 PM

## 2013-03-07 NOTE — ED Notes (Addendum)
Pt returned from Xray, Phlebotomy at bedside.

## 2013-03-07 NOTE — ED Notes (Signed)
Per EMS:  "History of Parkinsons Walking on feet trying to get cain, fell landed on right hip at approx 1800 today, right hip pain only on movement, no deformities, no rotation/shortening noted."  VS: 160/90, HR 66,   GCS 15

## 2013-03-07 NOTE — Consult Note (Signed)
Reason for Consult:  Right hip fracture Referring Physician: ED MD  Kevin Hendrix is an 79 y.o. male.  HPI:   79 yo male with Parkinson's disease and a shuffling gate who sustained a mechanical fall at home today injuring his right hip.  He was brought to the ED complaining of right hip pain and the inability to ambulate.  He denies LOC or other injuries.  He was found to have a right hip fracture and orthopedic surgery is consulted  Past Medical History  Diagnosis Date  . Hypertension   . Parkinson disease   . Recurrent falls 03/01/2013  . Parkinson's disease 03/01/2013    Past Surgical History  Procedure Laterality Date  . Inguinal hernia repair    . Appendectomy    . Tonsillectomy      Family History  Problem Relation Age of Onset  . Diabetes Mother   . Diabetes Father     Social History:  reports that he quit smoking about 38 years ago. His smoking use included Cigarettes. He smoked 0.00 packs per day. He has never used smokeless tobacco. He reports that he does not drink alcohol or use illicit drugs.  Allergies: No Known Allergies  Medications: I have reviewed the patient's current medications.  Results for orders placed during the hospital encounter of 03/07/13 (from the past 48 hour(s))  TYPE AND SCREEN     Status: None   Collection Time    03/07/13  9:21 PM      Result Value Range   ABO/RH(D) O POS     Antibody Screen NEG     Sample Expiration 03/10/2013    ABO/RH     Status: None   Collection Time    03/07/13  9:21 PM      Result Value Range   ABO/RH(D) O POS    CBC WITH DIFFERENTIAL     Status: Abnormal   Collection Time    03/07/13  9:23 PM      Result Value Range   WBC 5.6  4.0 - 10.5 K/uL   RBC 3.50 (*) 4.22 - 5.81 MIL/uL   Hemoglobin 10.9 (*) 13.0 - 17.0 g/dL   HCT 47.8 (*) 29.5 - 62.1 %   MCV 91.1  78.0 - 100.0 fL   MCH 31.1  26.0 - 34.0 pg   MCHC 34.2  30.0 - 36.0 g/dL   RDW 30.8  65.7 - 84.6 %   Platelets 135 (*) 150 - 400 K/uL   Neutrophils Relative % 76  43 - 77 %   Neutro Abs 4.2  1.7 - 7.7 K/uL   Lymphocytes Relative 15  12 - 46 %   Lymphs Abs 0.9  0.7 - 4.0 K/uL   Monocytes Relative 7  3 - 12 %   Monocytes Absolute 0.4  0.1 - 1.0 K/uL   Eosinophils Relative 1  0 - 5 %   Eosinophils Absolute 0.1  0.0 - 0.7 K/uL   Basophils Relative 0  0 - 1 %   Basophils Absolute 0.0  0.0 - 0.1 K/uL  COMPREHENSIVE METABOLIC PANEL     Status: Abnormal   Collection Time    03/07/13  9:23 PM      Result Value Range   Sodium 139  135 - 145 mEq/L   Potassium 3.9  3.5 - 5.1 mEq/L   Chloride 103  96 - 112 mEq/L   CO2 28  19 - 32 mEq/L   Glucose, Bld 115 (*) 70 -  99 mg/dL   BUN 21  6 - 23 mg/dL   Creatinine, Ser 1.61  0.50 - 1.35 mg/dL   Calcium 9.3  8.4 - 09.6 mg/dL   Total Protein 7.0  6.0 - 8.3 g/dL   Albumin 3.6  3.5 - 5.2 g/dL   AST 16  0 - 37 U/L   ALT <5  0 - 53 U/L   Alkaline Phosphatase 67  39 - 117 U/L   Total Bilirubin 0.3  0.3 - 1.2 mg/dL   GFR calc non Af Amer 48 (*) >90 mL/min   GFR calc Af Amer 55 (*) >90 mL/min   Comment: (NOTE)     The eGFR has been calculated using the CKD EPI equation.     This calculation has not been validated in all clinical situations.     eGFR's persistently <90 mL/min signify possible Chronic Kidney     Disease.  PROTIME-INR     Status: None   Collection Time    03/07/13  9:23 PM      Result Value Range   Prothrombin Time 14.0  11.6 - 15.2 seconds   INR 1.10  0.00 - 1.49    Dg Chest 2 View  03/07/2013   *RADIOLOGY REPORT*  Clinical Data: Shortness of breath  CHEST - 2 VIEW  Comparison: 06/29/2012  Findings: The cardiac shadow is stable.  The lungs are clear bilaterally.  No pneumothorax is seen.  No acute bony abnormality is noted.  Degenerative changes of the shoulder joints are seen.  IMPRESSION: No acute abnormality noted.   Original Report Authenticated By: Alcide Clever, M.D.   Dg Hip Complete Right  03/07/2013   *RADIOLOGY REPORT*  Clinical Data: Pain.  RIGHT HIP -  COMPLETE 2+ VIEW  Comparison: None.  Findings: There is evidence of acute intertrochanteric fracture of the proximal right femur.  The lesser trochanter is displaced by approximately 7 mm.  The rest the fractures shows fairly mild displacement but does appear extend up through the level of the greater trochanter.  IMPRESSION: Acute intertrochanteric fracture of the hip with relatively mild displacement.   Original Report Authenticated By: Irish Lack, M.D.    ROS Blood pressure 157/73, pulse 71, temperature 98.2 F (36.8 C), temperature source Oral, resp. rate 30, SpO2 98.00%. Physical Exam  Musculoskeletal:       Right hip: He exhibits decreased range of motion, tenderness, bony tenderness and deformity.  His skin over his right hip is intact.  His right foot is well-perfused and he can move his toes. He has severe right hip pain with any attempts to move his right hip  Assessment/Plan: Right intertrochanteric femur/hip fracture 1)  I spoke with him and his family in length.  This fracture will need open reduction/internal fixation with a rod/hip screw construct thru small incisions.  He understands this fully.  He will need a medical evaluation for preoperative clearance.  He understands the risks of acute blood loss, infection, DVT, and even death given his age.  He is likely to intermediate cardiac risk, but this will be further evaluated by the internal medicine MD. 2)  NPO after midnight tonight with the plan to proceed to the OR tomorrow late am.  Kathryne Hitch 03/07/2013, 10:21 PM

## 2013-03-07 NOTE — ED Notes (Signed)
Orthopedic MD at bedside, updated that pt still in X-ray

## 2013-03-08 ENCOUNTER — Inpatient Hospital Stay (HOSPITAL_COMMUNITY): Payer: Medicare Other | Admitting: Anesthesiology

## 2013-03-08 ENCOUNTER — Encounter (HOSPITAL_COMMUNITY): Payer: Self-pay | Admitting: Anesthesiology

## 2013-03-08 ENCOUNTER — Inpatient Hospital Stay (HOSPITAL_COMMUNITY): Payer: Medicare Other

## 2013-03-08 ENCOUNTER — Encounter (HOSPITAL_COMMUNITY)
Admission: EM | Disposition: A | Discharge status: Skilled Nursing Facility | Payer: Self-pay | Source: Home / Self Care | Attending: Internal Medicine

## 2013-03-08 HISTORY — PX: INTRAMEDULLARY (IM) NAIL INTERTROCHANTERIC: SHX5875

## 2013-03-08 LAB — BASIC METABOLIC PANEL
BUN: 19 mg/dL (ref 6–23)
CO2: 29 mEq/L (ref 19–32)
Calcium: 9.3 mg/dL (ref 8.4–10.5)
Chloride: 106 mEq/L (ref 96–112)
Creatinine, Ser: 1.3 mg/dL (ref 0.50–1.35)
GFR calc Af Amer: 57 mL/min — ABNORMAL LOW (ref >90)
GFR calc non Af Amer: 49 mL/min — ABNORMAL LOW (ref >90)
Glucose, Bld: 121 mg/dL — ABNORMAL HIGH (ref 70–99)
Potassium: 4 mEq/L (ref 3.5–5.1)
Sodium: 143 mEq/L (ref 135–145)

## 2013-03-08 LAB — CBC
HCT: 29.5 % — ABNORMAL LOW (ref 39.0–52.0)
MCH: 31 pg (ref 26.0–34.0)
MCV: 91.3 fL (ref 78.0–100.0)
Platelets: 131 10*3/uL — ABNORMAL LOW (ref 150–400)
RBC: 3.23 MIL/uL — ABNORMAL LOW (ref 4.22–5.81)
WBC: 6.3 10*3/uL (ref 4.0–10.5)

## 2013-03-08 LAB — VITAMIN D 25 HYDROXY (VIT D DEFICIENCY, FRACTURES): Vit D, 25-Hydroxy: 18 ng/mL — ABNORMAL LOW (ref 30–89)

## 2013-03-08 SURGERY — FIXATION, FRACTURE, INTERTROCHANTERIC, WITH INTRAMEDULLARY ROD
Anesthesia: General | Site: Hip | Laterality: Right | Wound class: Clean

## 2013-03-08 MED ORDER — CEFAZOLIN SODIUM-DEXTROSE 2-3 GM-% IV SOLR
2.0000 g | Freq: Four times a day (QID) | INTRAVENOUS | Status: AC
  Administered 2013-03-08 (×2): 2 g via INTRAVENOUS
  Filled 2013-03-08 (×2): qty 50

## 2013-03-08 MED ORDER — 0.9 % SODIUM CHLORIDE (POUR BTL) OPTIME
TOPICAL | Status: DC | PRN
  Administered 2013-03-08: 1000 mL

## 2013-03-08 MED ORDER — HYDROCODONE-ACETAMINOPHEN 5-325 MG PO TABS
1.0000 | ORAL_TABLET | Freq: Four times a day (QID) | ORAL | Status: DC | PRN
  Administered 2013-03-09: 1 via ORAL
  Administered 2013-03-09 – 2013-03-13 (×4): 2 via ORAL

## 2013-03-08 MED ORDER — SODIUM CHLORIDE 0.9 % IV SOLN
INTRAVENOUS | Status: DC
  Administered 2013-03-08 – 2013-03-12 (×5): via INTRAVENOUS

## 2013-03-08 MED ORDER — METHOCARBAMOL 100 MG/ML IJ SOLN
500.0000 mg | Freq: Four times a day (QID) | INTRAVENOUS | Status: DC | PRN
  Filled 2013-03-08: qty 5

## 2013-03-08 MED ORDER — METOCLOPRAMIDE HCL 5 MG/ML IJ SOLN
5.0000 mg | Freq: Three times a day (TID) | INTRAMUSCULAR | Status: DC | PRN
  Filled 2013-03-08: qty 2

## 2013-03-08 MED ORDER — PHENOL 1.4 % MT LIQD
1.0000 | OROMUCOSAL | Status: DC | PRN
  Filled 2013-03-08: qty 177

## 2013-03-08 MED ORDER — SENNOSIDES-DOCUSATE SODIUM 8.6-50 MG PO TABS
1.0000 | ORAL_TABLET | Freq: Two times a day (BID) | ORAL | Status: DC
  Administered 2013-03-08 – 2013-03-12 (×9): 1 via ORAL
  Filled 2013-03-08 (×9): qty 1

## 2013-03-08 MED ORDER — NEOSTIGMINE METHYLSULFATE 1 MG/ML IJ SOLN
INTRAMUSCULAR | Status: DC | PRN
  Administered 2013-03-08: 3 mg via INTRAVENOUS

## 2013-03-08 MED ORDER — GLYCOPYRROLATE 0.2 MG/ML IJ SOLN
INTRAMUSCULAR | Status: DC | PRN
  Administered 2013-03-08: 0.6 mg via INTRAVENOUS

## 2013-03-08 MED ORDER — MORPHINE SULFATE 2 MG/ML IJ SOLN: 0.5000 mg | INTRAMUSCULAR | Status: DC | PRN

## 2013-03-08 MED ORDER — METOCLOPRAMIDE HCL 10 MG PO TABS: 5.0000 mg | ORAL_TABLET | Freq: Three times a day (TID) | ORAL | Status: DC | PRN

## 2013-03-08 MED ORDER — MENTHOL 3 MG MT LOZG
1.0000 | LOZENGE | OROMUCOSAL | Status: DC | PRN
  Filled 2013-03-08: qty 9

## 2013-03-08 MED ORDER — ROCURONIUM BROMIDE 100 MG/10ML IV SOLN
INTRAVENOUS | Status: DC | PRN
  Administered 2013-03-08: 40 mg via INTRAVENOUS

## 2013-03-08 MED ORDER — FENTANYL CITRATE 0.05 MG/ML IJ SOLN
25.0000 ug | INTRAMUSCULAR | Status: DC | PRN
  Administered 2013-03-08 (×2): 25 ug via INTRAVENOUS

## 2013-03-08 MED ORDER — ASPIRIN EC 325 MG PO TBEC
325.0000 mg | DELAYED_RELEASE_TABLET | Freq: Every day | ORAL | Status: DC
  Administered 2013-03-09 – 2013-03-14 (×6): 325 mg via ORAL
  Filled 2013-03-08 (×7): qty 1

## 2013-03-08 MED ORDER — ACETAMINOPHEN 325 MG PO TABS: 650.0000 mg | ORAL_TABLET | Freq: Four times a day (QID) | ORAL | Status: DC | PRN

## 2013-03-08 MED ORDER — FENTANYL CITRATE 0.05 MG/ML IJ SOLN
INTRAMUSCULAR | Status: AC
  Filled 2013-03-08: qty 2

## 2013-03-08 MED ORDER — POLYETHYLENE GLYCOL 3350 17 G PO PACK
17.0000 g | PACK | Freq: Every day | ORAL | Status: DC
  Administered 2013-03-09 – 2013-03-14 (×6): 17 g via ORAL
  Filled 2013-03-08 (×6): qty 1

## 2013-03-08 MED ORDER — LACTATED RINGERS IV SOLN
INTRAVENOUS | Status: DC
  Administered 2013-03-08: 11:00:00 via INTRAVENOUS

## 2013-03-08 MED ORDER — PROPOFOL 10 MG/ML IV BOLUS
INTRAVENOUS | Status: DC | PRN
  Administered 2013-03-08: 120 mg via INTRAVENOUS

## 2013-03-08 MED ORDER — METHOCARBAMOL 500 MG PO TABS
500.0000 mg | ORAL_TABLET | Freq: Four times a day (QID) | ORAL | Status: DC | PRN
  Filled 2013-03-08: qty 1

## 2013-03-08 MED ORDER — ACETAMINOPHEN 650 MG RE SUPP: 650.0000 mg | Freq: Four times a day (QID) | RECTAL | Status: DC | PRN

## 2013-03-08 MED ORDER — ONDANSETRON HCL 4 MG PO TABS: 4.0000 mg | ORAL_TABLET | Freq: Four times a day (QID) | ORAL | Status: DC | PRN

## 2013-03-08 MED ORDER — FENTANYL CITRATE 0.05 MG/ML IJ SOLN
INTRAMUSCULAR | Status: DC | PRN
  Administered 2013-03-08: 50 ug via INTRAVENOUS
  Administered 2013-03-08: 100 ug via INTRAVENOUS

## 2013-03-08 MED ORDER — CEFAZOLIN SODIUM-DEXTROSE 2-3 GM-% IV SOLR
INTRAVENOUS | Status: AC
  Filled 2013-03-08: qty 50

## 2013-03-08 MED ORDER — LIDOCAINE HCL (CARDIAC) 20 MG/ML IV SOLN
INTRAVENOUS | Status: DC | PRN
  Administered 2013-03-08: 80 mg via INTRAVENOUS

## 2013-03-08 MED ORDER — LACTATED RINGERS IV SOLN
INTRAVENOUS | Status: DC | PRN
  Administered 2013-03-08: 11:00:00 via INTRAVENOUS

## 2013-03-08 MED ORDER — ONDANSETRON HCL 4 MG/2ML IJ SOLN
INTRAMUSCULAR | Status: DC | PRN
  Administered 2013-03-08: 4 mg via INTRAVENOUS

## 2013-03-08 MED ORDER — DOCUSATE SODIUM 100 MG PO CAPS
100.0000 mg | ORAL_CAPSULE | Freq: Two times a day (BID) | ORAL | Status: DC
  Administered 2013-03-08 – 2013-03-14 (×11): 100 mg via ORAL
  Filled 2013-03-08 (×14): qty 1

## 2013-03-08 MED ORDER — ONDANSETRON HCL 4 MG/2ML IJ SOLN
4.0000 mg | Freq: Four times a day (QID) | INTRAMUSCULAR | Status: DC | PRN
  Filled 2013-03-08: qty 2

## 2013-03-08 SURGICAL SUPPLY — 50 items
BANDAGE GAUZE ELAST BULKY 4 IN (GAUZE/BANDAGES/DRESSINGS) IMPLANT
BLADE SURG 15 STRL LF DISP TIS (BLADE) ×1 IMPLANT
BLADE SURG 15 STRL SS (BLADE)
BNDG COHESIVE 4X5 TAN NS LF (GAUZE/BANDAGES/DRESSINGS) ×2 IMPLANT
CLOTH BEACON ORANGE TIMEOUT ST (SAFETY) ×2 IMPLANT
COVER SURGICAL LIGHT HANDLE (MISCELLANEOUS) ×2 IMPLANT
DRAPE PROXIMA HALF (DRAPES) IMPLANT
DRAPE STERI IOBAN 125X83 (DRAPES) ×2 IMPLANT
DRSG MEPILEX BORDER 4X4 (GAUZE/BANDAGES/DRESSINGS) ×2 IMPLANT
DRSG MEPILEX BORDER 4X8 (GAUZE/BANDAGES/DRESSINGS) ×1 IMPLANT
DRSG PAD ABDOMINAL 8X10 ST (GAUZE/BANDAGES/DRESSINGS) ×4 IMPLANT
DURAPREP 26ML APPLICATOR (WOUND CARE) ×2 IMPLANT
ELECT REM PT RETURN 9FT ADLT (ELECTROSURGICAL) ×2
ELECTRODE REM PT RTRN 9FT ADLT (ELECTROSURGICAL) ×1 IMPLANT
FACESHIELD LNG OPTICON STERILE (SAFETY) ×2 IMPLANT
GAUZE XEROFORM 5X9 LF (GAUZE/BANDAGES/DRESSINGS) ×1 IMPLANT
GLOVE BIO SURGEON STRL SZ8 (GLOVE) ×2 IMPLANT
GLOVE BIOGEL PI IND STRL 6.5 (GLOVE) IMPLANT
GLOVE BIOGEL PI IND STRL 8 (GLOVE) ×1 IMPLANT
GLOVE BIOGEL PI INDICATOR 6.5 (GLOVE) ×2
GLOVE BIOGEL PI INDICATOR 8 (GLOVE) ×2
GLOVE ECLIPSE 6.5 STRL STRAW (GLOVE) ×4 IMPLANT
GLOVE ORTHO TXT STRL SZ7.5 (GLOVE) ×2 IMPLANT
GOWN PREVENTION PLUS LG XLONG (DISPOSABLE) IMPLANT
GOWN PREVENTION PLUS XLARGE (GOWN DISPOSABLE) ×3 IMPLANT
GOWN STRL NON-REIN LRG LVL3 (GOWN DISPOSABLE) ×4 IMPLANT
GUIDE PIN 3.2 LONG (PIN) ×1 IMPLANT
GUIDE PIN 3.2MM (MISCELLANEOUS) ×2
GUIDE PIN ORTH 343X3.2XBRAD (MISCELLANEOUS) IMPLANT
HIP SCREW SET (Screw) ×2 IMPLANT
KIT BASIN OR (CUSTOM PROCEDURE TRAY) ×2 IMPLANT
KIT ROOM TURNOVER OR (KITS) ×2 IMPLANT
MANIFOLD NEPTUNE II (INSTRUMENTS) ×1 IMPLANT
NAIL IMHS 130 10X40MM (Nail) ×2 IMPLANT
NS IRRIG 1000ML POUR BTL (IV SOLUTION) ×2 IMPLANT
PACK GENERAL/GYN (CUSTOM PROCEDURE TRAY) ×2 IMPLANT
PAD ARMBOARD 7.5X6 YLW CONV (MISCELLANEOUS) ×4 IMPLANT
PAD CAST 4YDX4 CTTN HI CHSV (CAST SUPPLIES) ×2 IMPLANT
PADDING CAST COTTON 4X4 STRL (CAST SUPPLIES) ×2
SCREW COMPRESSION (Screw) ×1 IMPLANT
SCREW LAG 95MM (Screw) ×2 IMPLANT
SCREW LAGSTD 95X21X12.7X9 (Screw) IMPLANT
STAPLER VISISTAT 35W (STAPLE) ×2 IMPLANT
SUT VIC AB 0 CT1 27 (SUTURE) ×4
SUT VIC AB 0 CT1 27XBRD ANBCTR (SUTURE) ×2 IMPLANT
SUT VIC AB 2-0 CT1 27 (SUTURE) ×4
SUT VIC AB 2-0 CT1 TAPERPNT 27 (SUTURE) ×2 IMPLANT
TOWEL OR 17X24 6PK STRL BLUE (TOWEL DISPOSABLE) ×2 IMPLANT
TOWEL OR 17X26 10 PK STRL BLUE (TOWEL DISPOSABLE) ×2 IMPLANT
WATER STERILE IRR 1000ML POUR (IV SOLUTION) ×2 IMPLANT

## 2013-03-08 NOTE — Progress Notes (Signed)
Subjective: [name] is a very pleasant 79 year old male who unfortunately has significant Parkinson's disease and a chronic gait disorder.  He suffered a mechanical fall at home yesterday and now has a right hip fracture.  He also has a history of hypertension and BPH.  He has no symptoms to suggest unstable ischemic heart disease or significant pulmonary disease though he was a smoker in the distant past.  He is at relatively low risk for cardiac or pulmonary complications from upcoming right hip surgery today.  He is n.p.o. this morning  Objective: Weight change:   Intake/Output Summary (Last 24 hours) at 03/08/13 0807 Last data filed at 03/08/13 0539  Gross per 24 hour  Intake      0 ml  Output    300 ml  Net   -300 ml   Filed Vitals:   03/08/13 0233 03/08/13 0400 03/08/13 0427 03/08/13 0529  BP: 163/59  166/67 181/83  Pulse: 68  75 71  Temp: 98.2 F (36.8 C)   98.7 F (37.1 C)  TempSrc: Oral   Oral  Resp: 18 18 18 20   Height:      Weight:    64.2 kg (141 lb 8.6 oz)  SpO2: 99% 99% 97% 98%   General: Well-developed and nourished. Eyes: Anicteric no pallor. ENT: No discharge from the ears eyes nose mouth. Neck: No mass felt.  Cardiovascular: S1-S2 heard. Respiratory: No rhonchi or crepitations. Abdomen: Soft nontender but chronically mildly distended but bowel sounds are normal nontender. Skin: No rash. Musculoskeletal: Pain on moving right hip. Psychiatric: Mood is good. Neurologic: Alert with a masked face appearance but cognitive abilities are excellent.   Lab Results: Results for orders placed during the hospital encounter of 03/07/13 (from the past 48 hour(s))  TYPE AND SCREEN     Status: None   Collection Time    03/07/13  9:21 PM      Result Value Range   ABO/RH(D) O POS     Antibody Screen NEG     Sample Expiration 03/10/2013    ABO/RH     Status: None   Collection Time    03/07/13  9:21 PM      Result Value Range   ABO/RH(D) O POS    CBC WITH  DIFFERENTIAL     Status: Abnormal   Collection Time    03/07/13  9:23 PM      Result Value Range   WBC 5.6  4.0 - 10.5 K/uL   RBC 3.50 (*) 4.22 - 5.81 MIL/uL   Hemoglobin 10.9 (*) 13.0 - 17.0 g/dL   HCT 45.4 (*) 09.8 - 11.9 %   MCV 91.1  78.0 - 100.0 fL   MCH 31.1  26.0 - 34.0 pg   MCHC 34.2  30.0 - 36.0 g/dL   RDW 14.7  82.9 - 56.2 %   Platelets 135 (*) 150 - 400 K/uL   Neutrophils Relative % 76  43 - 77 %   Neutro Abs 4.2  1.7 - 7.7 K/uL   Lymphocytes Relative 15  12 - 46 %   Lymphs Abs 0.9  0.7 - 4.0 K/uL   Monocytes Relative 7  3 - 12 %   Monocytes Absolute 0.4  0.1 - 1.0 K/uL   Eosinophils Relative 1  0 - 5 %   Eosinophils Absolute 0.1  0.0 - 0.7 K/uL   Basophils Relative 0  0 - 1 %   Basophils Absolute 0.0  0.0 - 0.1 K/uL  COMPREHENSIVE METABOLIC PANEL     Status: Abnormal   Collection Time    03/07/13  9:23 PM      Result Value Range   Sodium 139  135 - 145 mEq/L   Potassium 3.9  3.5 - 5.1 mEq/L   Chloride 103  96 - 112 mEq/L   CO2 28  19 - 32 mEq/L   Glucose, Bld 115 (*) 70 - 99 mg/dL   BUN 21  6 - 23 mg/dL   Creatinine, Ser 1.61  0.50 - 1.35 mg/dL   Calcium 9.3  8.4 - 09.6 mg/dL   Total Protein 7.0  6.0 - 8.3 g/dL   Albumin 3.6  3.5 - 5.2 g/dL   AST 16  0 - 37 U/L   ALT <5  0 - 53 U/L   Alkaline Phosphatase 67  39 - 117 U/L   Total Bilirubin 0.3  0.3 - 1.2 mg/dL   GFR calc non Af Amer 48 (*) >90 mL/min   GFR calc Af Amer 55 (*) >90 mL/min   Comment: (NOTE)     The eGFR has been calculated using the CKD EPI equation.     This calculation has not been validated in all clinical situations.     eGFR's persistently <90 mL/min signify possible Chronic Kidney     Disease.  PROTIME-INR     Status: None   Collection Time    03/07/13  9:23 PM      Result Value Range   Prothrombin Time 14.0  11.6 - 15.2 seconds   INR 1.10  0.00 - 1.49  URINALYSIS, ROUTINE W REFLEX MICROSCOPIC     Status: None   Collection Time    03/07/13 10:17 PM      Result Value Range    Color, Urine YELLOW  YELLOW   APPearance CLEAR  CLEAR   Specific Gravity, Urine 1.026  1.005 - 1.030   pH 5.0  5.0 - 8.0   Glucose, UA NEGATIVE  NEGATIVE mg/dL   Hgb urine dipstick NEGATIVE  NEGATIVE   Bilirubin Urine NEGATIVE  NEGATIVE   Ketones, ur NEGATIVE  NEGATIVE mg/dL   Protein, ur NEGATIVE  NEGATIVE mg/dL   Urobilinogen, UA 1.0  0.0 - 1.0 mg/dL   Nitrite NEGATIVE  NEGATIVE   Leukocytes, UA NEGATIVE  NEGATIVE   Comment: MICROSCOPIC NOT DONE ON URINES WITH NEGATIVE PROTEIN, BLOOD, LEUKOCYTES, NITRITE, OR GLUCOSE <1000 mg/dL.  SURGICAL PCR SCREEN     Status: None   Collection Time    03/07/13 11:50 PM      Result Value Range   MRSA, PCR NEGATIVE  NEGATIVE   Staphylococcus aureus NEGATIVE  NEGATIVE   Comment:            The Xpert SA Assay (FDA     approved for NASAL specimens     in patients over 43 years of age),     is one component of     a comprehensive surveillance     program.  Test performance has     been validated by [employer] for patients greater     than or equal to 44 year old.     It is not intended     to diagnose infection nor to     guide or monitor treatment.  CBC     Status: Abnormal   Collection Time    03/08/13  5:00 AM      Result Value Range  WBC 6.3  4.0 - 10.5 K/uL   RBC 3.23 (*) 4.22 - 5.81 MIL/uL   Hemoglobin 10.0 (*) 13.0 - 17.0 g/dL   HCT 45.4 (*) 09.8 - 11.9 %   MCV 91.3  78.0 - 100.0 fL   MCH 31.0  26.0 - 34.0 pg   MCHC 33.9  30.0 - 36.0 g/dL   RDW 14.7  82.9 - 56.2 %   Platelets 131 (*) 150 - 400 K/uL  BASIC METABOLIC PANEL     Status: Abnormal   Collection Time    03/08/13  5:00 AM      Result Value Range   Sodium 143  135 - 145 mEq/L   Potassium 4.0  3.5 - 5.1 mEq/L   Chloride 106  96 - 112 mEq/L   CO2 29  19 - 32 mEq/L   Glucose, Bld 121 (*) 70 - 99 mg/dL   BUN 19  6 - 23 mg/dL   Creatinine, Ser 1.30  0.50 - 1.35 mg/dL   Calcium 9.3  8.4 - 86.5 mg/dL   GFR calc non Af Amer 49 (*) >90 mL/min   GFR calc Af Amer 57  (*) >90 mL/min   Comment: (NOTE)     The eGFR has been calculated using the CKD EPI equation.     This calculation has not been validated in all clinical situations.     eGFR's persistently <90 mL/min signify possible Chronic Kidney     Disease.    Studies/Results: Dg Chest 2 View  03/07/2013   *RADIOLOGY REPORT*  Clinical Data: Shortness of breath  CHEST - 2 VIEW  Comparison: 06/29/2012  Findings: The cardiac shadow is stable.  The lungs are clear bilaterally.  No pneumothorax is seen.  No acute bony abnormality is noted.  Degenerative changes of the shoulder joints are seen.  IMPRESSION: No acute abnormality noted.   Original Report Authenticated By: Alcide Clever, M.D.   Dg Hip Complete Right  03/07/2013   *RADIOLOGY REPORT*  Clinical Data: Pain.  RIGHT HIP - COMPLETE 2+ VIEW  Comparison: None.  Findings: There is evidence of acute intertrochanteric fracture of the proximal right femur.  The lesser trochanter is displaced by approximately 7 mm.  The rest the fractures shows fairly mild displacement but does appear extend up through the level of the greater trochanter.  IMPRESSION: Acute intertrochanteric fracture of the hip with relatively mild displacement.   Original Report Authenticated By: Irish Lack, M.D.   Medications: Scheduled Meds: . amLODipine  5 mg Oral Daily  . carbidopa-levodopa  1 tablet Oral BID   And  . carbidopa-levodopa  0.5 tablet Oral BID  . finasteride  5 mg Oral Daily  . irbesartan  150 mg Oral Daily  . pneumococcal 23 valent vaccine  0.5 mL Intramuscular Tomorrow-1000  . [START ON 03/09/2013] polyethylene glycol  17 g Oral Daily  . senna-docusate  1 tablet Oral BID   Continuous Infusions: . sodium chloride 20 mL/hr (03/07/13 2337)   PRN Meds:.hydrALAZINE, HYDROcodone-acetaminophen, morphine injection  Assessment/Plan: Principal Problem:   Hip fracture Active Problems:   Parkinson's disease   HTN (hypertension)   Anemia   BPH   FULL CODE  1. Right  hip fracture status post mechanical fall - from medical standpoint he is stable for surgery. Patient has been placed n.p.o. past midnight in anticipation of surgery. Continue with pain relief medications. Further recommendations per orthopedic surgery. 2. Hypertension - was mildly uncontrolled in the ER. Probably secondary to pain.  Continue Patient's home medications except for Lasix. Lasix to be continued after surgery once stable. When necessary IV hydralazine for systolic blood pressure more than 160. 3. Anemia and mild thrombocytopenia - follow CBC closely. 4. Parkinson's disease - continue home medications. 5. BPH - continue Proscar   LOS: 1 day   Pearla Dubonnet, MD 03/08/2013, 8:07 AM

## 2013-03-08 NOTE — Brief Op Note (Signed)
03/07/2013 - 03/08/2013  12:38 PM  PATIENT:  Kevin Hendrix  79 y.o. male  PRE-OPERATIVE DIAGNOSIS:  Right intertroch hip fracture  POST-OPERATIVE DIAGNOSIS:  Right intertroch hip fracture  PROCEDURE:  Procedure(s): INTRAMEDULLARY (IM) NAIL INTERTROCHANTRIC (Right)  SURGEON:  Surgeon(s) and Role:    * Kathryne Hitch, MD - Primary  PHYSICIAN ASSISTANT: Rexene Edison, PA-C  ANESTHESIA:   general  EBL:  Total I/O In: 30 [I.V.:30] Out: -   BLOOD ADMINISTERED:none  DRAINS: none   LOCAL MEDICATIONS USED:  NONE  SPECIMEN:  No Specimen  DISPOSITION OF SPECIMEN:  N/A  COUNTS:  YES  TOURNIQUET:  * No tourniquets in log *  DICTATION: .Other Dictation: Dictation Number [id number]  PLAN OF CARE: Admit to inpatient   PATIENT DISPOSITION:  PACU - hemodynamically stable.   Delay start of Pharmacological VTE agent (>24hrs) due to surgical blood loss or risk of bleeding: no

## 2013-03-08 NOTE — Op Note (Signed)
NAMEMarland Kitchen  PIO, EATHERLY NO.:  [id number]  MEDICAL RECORD NO.:  [id number]  LOCATION:  [id number]                        FACILITY:  [facility]  PHYSICIAN:  Kevin Hendrix, M.D.DATE OF BIRTH:  17-Jan-1929  DATE OF PROCEDURE:  03/08/2013 DATE OF DISCHARGE:                              OPERATIVE REPORT   PREOPERATIVE DIAGNOSIS:  Right intertrochanteric femur/hip fracture.  POSTOPERATIVE DIAGNOSIS:  Right intertrochanteric femur/hip fracture.  PROCEDURE:  Open reduction and internal fixation of right intertrochanteric femur/hip fracture.  IMPLANTS:  [employer] 10 x 40 intramedullary nail with a 95-mm lag screw and compression screw.  SURGEON:  Kevin Hendrix, M.D.  ASSISTANT:  Richardean Canal, PA-C  ANESTHESIA:  General.  BLOOD LOSS:  Less than 100 mL.  ANTIBIOTICS:  2 g of IV Ancef.  COMPLICATIONS:  None.  INDICATION:  Kevin Hendrix is an 79 year old gentleman who sustained a mechanical fall at home yesterday.  He has Parkinson disease and a shuffling gait.  He had difficulty weightbearing after this and pain in his right hip.  He was seen at the [facility] room last evening and found to have an intertrochanteric hip fracture of the right hip.  He was admitted to the Medicine Service and cleared for surgery for today.  He now presents for this surgery.  The risks and benefits of this have been explained to him and his family in great detail and they do wish to proceed with surgery.  PROCEDURE DESCRIPTION:  After informed consent was obtained, appropriate right hip was marked.  He was brought to the operating room and anesthesia was obtained while he was on the stretcher.  He was then placed supine on the fracture table with the perineal post in place. His right operative leg in in-line skeletal traction and the left hip flexed and abducted out of the field with a stirrup and placed in appropriate padding in the popliteal area.  We  then had the bed raise and assessed the fracture under direct fluoroscopy.  With traction and internal rotation, we were able to reduce the fracture.  We then prepped his right hip with DuraPrep and sterile shower curtain drape.  Time-out was called and he was identified as correct patient and correct right hip.  We then made an incision just proximal to the greater trochanter and dissected down to the tip of the greater trochanter.  A guidepin was then inserted in antegrade fashion and it was verified placement under direct fluoroscopy.  We then used initiating reamer to open up the femoral canal.  We had already pre-chosen a 10 x 40 nail with preoperative fluoroscopy and so we placed the Real 10 x 40 intramedullary nail down the femoral canal.  Using the outrigger guide, we then made a separate incision in the lateral thigh and placed a guidepin from the lateral cortex traversing the fracture into a center- center position in the femoral head.  We took a measurement off of this and chose a 95-mm lag screw.  We then drilled to this level and placed a 95-mm lag screw without complications.  We removed all guidepins and removed the outrigger guide.  We let traction off the bed and placed a  compression screw.  We then copiously irrigated both wounds in normal saline solution and closed the deep tissue with 0 Vicryl followed by 2-0 Vicryl in subcutaneous tissue and interrupted staples on the skin. Xeroform, a well-padded sterile dressing was applied.  He was taken off the fracture table, awakened, extubated and taken to the recovery room in stable condition.  All final counts were correct.  There were no complications noted.  Of note, Richardean Canal, PA-C assisted throughout the entire case.     Kevin Hendrix, M.D.     CYB/MEDQ  D:  03/08/2013  T:  03/08/2013  Job:  [id number]

## 2013-03-08 NOTE — Progress Notes (Addendum)
INITIAL NUTRITION ASSESSMENT  DOCUMENTATION CODES Per approved criteria  -Not Applicable   INTERVENTION:  Advance diet as medically appropriate RD to follow for nutrition care plan, add interventions accordingly  NUTRITION DIAGNOSIS: Increased nutrient needs related to hip fracture, post-op healing as evidenced by estimated nutrition needs  Goal: Pt to meet >/= 90% of their estimated nutrition needs   Monitor:  PO diet advancement & intake, weight, labs, I/O's  Reason for Assessment: Consult   79 y.o. male  Admitting Dx: Hip fracture  ASSESSMENT: Patient with PMH of HTN and Parkinson's disease had a fall at his house; ER X-rays revealed right hip fracture.  Patient s/p procedure 8/28: INTRAMEDULLARY NAIL INTERTROCHANTRIC (Right)  RD unable to obtain nutrition hx at time of visit; patient currently NPO post-op; would more than likely benefit from nutrition supplements for healing & recovery ---> RD to monitor.  Height: Ht Readings from Last 1 Encounters:  03/07/13 5\' 6"  (1.676 m)    Weight: Wt Readings from Last 1 Encounters:  03/08/13 141 lb 8.6 oz (64.2 kg)    Ideal Body Weight: 142 lb  % Ideal Body Weight: 99%  Wt Readings from Last 10 Encounters:  03/08/13 141 lb 8.6 oz (64.2 kg)  03/08/13 141 lb 8.6 oz (64.2 kg)  03/01/13 142 lb 8 oz (64.638 kg)    Usual Body Weight: unable to obtain  % Usual Body Weight: ---  BMI:  Body mass index is 22.86 kg/(m^2).  Estimated Nutritional Needs: Kcal: 1700-1900 Protein: 80-90 gm Fluid: 1.7-1.9 L  Skin: hip surgical incision   Diet Order: NPO  EDUCATION NEEDS: -No education needs identified at this time   Intake/Output Summary (Last 24 hours) at 03/08/13 1455 Last data filed at 03/08/13 1245  Gross per 24 hour  Intake    630 ml  Output    300 ml  Net    330 ml    Labs:   Recent Labs Lab 03/07/13 2123 03/08/13 0500  NA 139 143  K 3.9 4.0  CL 103 106  CO2 28 29  BUN 21 19  CREATININE 1.34  1.30  CALCIUM 9.3 9.3  GLUCOSE 115* 121*    Scheduled Meds: . amLODipine  5 mg Oral Daily  . [START ON 03/09/2013] aspirin EC  325 mg Oral Q breakfast  . carbidopa-levodopa  1 tablet Oral BID   And  . carbidopa-levodopa  0.5 tablet Oral BID  .  ceFAZolin (ANCEF) IV  2 g Intravenous Q6H  . docusate sodium  100 mg Oral BID  . finasteride  5 mg Oral Daily  . irbesartan  150 mg Oral Daily  . [START ON 03/09/2013] polyethylene glycol  17 g Oral Daily  . senna-docusate  1 tablet Oral BID    Continuous Infusions: . sodium chloride 20 mL/hr at 03/08/13 0812  . sodium chloride    . lactated ringers 50 mL/hr at 03/08/13 1035    Past Medical History  Diagnosis Date  . Hypertension   . Parkinson disease   . Recurrent falls 03/01/2013  . Parkinson's disease 03/01/2013    Past Surgical History  Procedure Laterality Date  . Inguinal hernia repair    . Appendectomy    . Tonsillectomy      Maureen Chatters, RD, LDN Pager #: [phone] After-Hours Pager #: [phone]

## 2013-03-08 NOTE — ED Provider Notes (Signed)
I saw and evaluated the patient, reviewed the resident's note and I agree with the findings and plan.   Right hip fx, NV intact. Ortho and medicine consulted for admission. Given pain control in ED.  Audree Camel, MD 03/08/13 1225

## 2013-03-08 NOTE — Progress Notes (Signed)
Orthopedic Tech Progress Note Patient Details:  Kevin Hendrix 1929-05-10 [id number]  Ortho Devices Ortho Device/Splint Location: applied overhead frame Ortho Device/Splint Interventions: Ordered;Application   [name] 03/08/2013, 7:16 PM

## 2013-03-08 NOTE — Anesthesia Preprocedure Evaluation (Addendum)
Anesthesia Evaluation  Patient identified by MRN, date of birth, ID band Patient awake    Reviewed: Allergy & Precautions, H&P , NPO status , Patient's Chart, lab work & pertinent test results  History of Anesthesia Complications Negative for: history of anesthetic complications  Airway Mallampati: I TM Distance: >3 FB Neck ROM: Limited    Dental  (+) Edentulous Upper, Edentulous Lower and Dental Advisory Given   Pulmonary former smoker,    Pulmonary exam normal       Cardiovascular hypertension,     Neuro/Psych Parkinsonism    GI/Hepatic negative GI ROS, Neg liver ROS,   Endo/Other  negative endocrine ROS  Renal/GU negative Renal ROS     Musculoskeletal   Abdominal   Peds  Hematology negative hematology ROS (+)   Anesthesia Other Findings   Reproductive/Obstetrics                          Anesthesia Physical Anesthesia Plan  ASA: III  Anesthesia Plan: General   Post-op Pain Management:    Induction: Intravenous  Airway Management Planned: Oral ETT  Additional Equipment:   Intra-op Plan:   Post-operative Plan: Extubation in OR  Informed Consent: I have reviewed the patients History and Physical, chart, labs and discussed the procedure including the risks, benefits and alternatives for the proposed anesthesia with the patient or authorized representative who has indicated his/her understanding and acceptance.   Dental advisory given  Plan Discussed with: CRNA, Anesthesiologist and Surgeon  Anesthesia Plan Comments:        Anesthesia Quick Evaluation

## 2013-03-08 NOTE — Progress Notes (Signed)
Utilization review completed. Schelly Chuba, RN, BSN. 

## 2013-03-08 NOTE — Transfer of Care (Signed)
Immediate Anesthesia Transfer of Care Note  Patient: Kevin Hendrix  Procedure(s) Performed: Procedure(s): INTRAMEDULLARY (IM) NAIL INTERTROCHANTRIC (Right)  Patient Location: PACU  Anesthesia Type:General  Level of Consciousness: awake, sedated and patient cooperative  Airway & Oxygen Therapy: Patient Spontanous Breathing and Patient connected to face mask oxygen  Post-op Assessment: Report given to PACU RN and Post -op Vital signs reviewed and stable  Post vital signs: Reviewed and stable  Complications: No apparent anesthesia complications

## 2013-03-08 NOTE — Care Management Note (Signed)
    Page 1 of 1   03/14/2013     2:36:52 PM   CARE MANAGEMENT NOTE 03/14/2013  Patient:  JARELLE, ATES   Account Number:  [id number]  Date Initiated:  03/08/2013  Documentation initiated by:  Letha Cape  Subjective/Objective Assessment:   dx hip fx  admit- lives with spouse.     Action/Plan:   await pt/ot eval   Anticipated DC Date:  03/14/2013   Anticipated DC Plan:  SKILLED NURSING FACILITY  In-house referral  Clinical Social Worker      DC Planning Services  CM consult      Choice offered to / List presented to:             Status of service:  Completed, signed off Medicare Important Message given?   (If response is "NO", the following Medicare IM given date fields will be blank) Date Medicare IM given:   Date Additional Medicare IM given:    Discharge Disposition:  SKILLED NURSING FACILITY  Per UR Regulation:  Reviewed for med. necessity/level of care/duration of stay  If discussed at Long Length of Stay Meetings, dates discussed:    Comments:  03/14/13 14;36 Letha Cape RN, BSN [phone] patient is for dc to [facility] today, CSW following.  03/08/13 15:42 Letha Cape RN, BSN [phone] patient lives with spouse, patient in with hip fx, most likely will need snf, await  pt/ot eval.  CSW referral.

## 2013-03-09 ENCOUNTER — Encounter (HOSPITAL_COMMUNITY): Payer: Self-pay | Admitting: Orthopaedic Surgery

## 2013-03-09 LAB — CBC WITH DIFFERENTIAL/PLATELET
Basophils Absolute: 0 10*3/uL (ref 0.0–0.1)
Eosinophils Absolute: 0 10*3/uL (ref 0.0–0.7)
Eosinophils Relative: 0 % (ref 0–5)
MCH: 30.9 pg (ref 26.0–34.0)
MCV: 89.7 fL (ref 78.0–100.0)
Platelets: 124 10*3/uL — ABNORMAL LOW (ref 150–400)
RDW: 14 % (ref 11.5–15.5)
WBC: 7.1 10*3/uL (ref 4.0–10.5)

## 2013-03-09 LAB — TSH: TSH: 0.56 u[IU]/mL (ref 0.350–4.500)

## 2013-03-09 LAB — BASIC METABOLIC PANEL
Calcium: 8.9 mg/dL (ref 8.4–10.5)
GFR calc Af Amer: 56 mL/min — ABNORMAL LOW (ref >90)
GFR calc non Af Amer: 49 mL/min — ABNORMAL LOW (ref >90)
Sodium: 139 mEq/L (ref 135–145)

## 2013-03-09 LAB — VITAMIN B12: Vitamin B-12: 219 pg/mL (ref 211–911)

## 2013-03-09 MED ORDER — RISPERIDONE 0.5 MG PO TBDP
0.5000 mg | ORAL_TABLET | Freq: Two times a day (BID) | ORAL | Status: DC
  Administered 2013-03-09: 0.5 mg via ORAL
  Filled 2013-03-09 (×4): qty 1

## 2013-03-09 MED ORDER — LORAZEPAM 2 MG/ML IJ SOLN: 0.5000 mg | INTRAMUSCULAR | Status: DC | PRN

## 2013-03-09 MED ORDER — LORAZEPAM 0.5 MG PO TABS: 0.5000 mg | ORAL_TABLET | ORAL | Status: DC | PRN

## 2013-03-09 NOTE — Progress Notes (Signed)
BP 110/70 at 1000. Patient usual SBP 160-180.  Patient received two vicodin for pain prior to PT.  Amlodipine and Avapro held. BP rechecked at 1330, 101/56. Continued to hold BP meds.  Primary RN aware. Will continue to monitor.

## 2013-03-09 NOTE — Progress Notes (Signed)
Subjective: Called by wife saying that Kevin Hendrix was thinking that he was falling and he is actually lying in bed and he is picking at things in the air that aren't there. Sounds like sundowning...Marland Kitchenotherwise vital signs look good and he is afebrile currently and other vital signs look good.  Objective: Weight change:   Intake/Output Summary (Last 24 hours) at 03/09/13 1808 Last data filed at 03/09/13 1330  Gross per 24 hour  Intake   1296 ml  Output    325 ml  Net    971 ml   Filed Vitals:   03/09/13 0641 03/09/13 0806 03/09/13 1020 03/09/13 1353  BP: 165/90 170/76 110/70 101/64  Pulse: 60 78 76 78  Temp: 100.2 F (37.9 C) 99 F (37.2 C)  98.4 F (36.9 C)  TempSrc: Oral Oral  Oral  Resp: 20 22 12 16   Height:      Weight:      SpO2: 95% 97%  97%   Lab Results: Results for orders placed during the hospital encounter of 03/07/13 (from the past 48 hour(s))  TYPE AND SCREEN     Status: None   Collection Time    03/07/13  9:21 PM      Result Value Range   ABO/RH(D) O POS     Antibody Screen NEG     Sample Expiration 03/10/2013    ABO/RH     Status: None   Collection Time    03/07/13  9:21 PM      Result Value Range   ABO/RH(D) O POS    CBC WITH DIFFERENTIAL     Status: Abnormal   Collection Time    03/07/13  9:23 PM      Result Value Range   WBC 5.6  4.0 - 10.5 K/uL   RBC 3.50 (*) 4.22 - 5.81 MIL/uL   Hemoglobin 10.9 (*) 13.0 - 17.0 g/dL   HCT 16.1 (*) 09.6 - 04.5 %   MCV 91.1  78.0 - 100.0 fL   MCH 31.1  26.0 - 34.0 pg   MCHC 34.2  30.0 - 36.0 g/dL   RDW 40.9  81.1 - 91.4 %   Platelets 135 (*) 150 - 400 K/uL   Neutrophils Relative % 76  43 - 77 %   Neutro Abs 4.2  1.7 - 7.7 K/uL   Lymphocytes Relative 15  12 - 46 %   Lymphs Abs 0.9  0.7 - 4.0 K/uL   Monocytes Relative 7  3 - 12 %   Monocytes Absolute 0.4  0.1 - 1.0 K/uL   Eosinophils Relative 1  0 - 5 %   Eosinophils Absolute 0.1  0.0 - 0.7 K/uL   Basophils Relative 0  0 - 1 %   Basophils Absolute 0.0  0.0 -  0.1 K/uL  COMPREHENSIVE METABOLIC PANEL     Status: Abnormal   Collection Time    03/07/13  9:23 PM      Result Value Range   Sodium 139  135 - 145 mEq/L   Potassium 3.9  3.5 - 5.1 mEq/L   Chloride 103  96 - 112 mEq/L   CO2 28  19 - 32 mEq/L   Glucose, Bld 115 (*) 70 - 99 mg/dL   BUN 21  6 - 23 mg/dL   Creatinine, Ser 7.82  0.50 - 1.35 mg/dL   Calcium 9.3  8.4 - 95.6 mg/dL   Total Protein 7.0  6.0 - 8.3 g/dL   Albumin 3.6  3.5 -  5.2 g/dL   AST 16  0 - 37 U/L   ALT <5  0 - 53 U/L   Alkaline Phosphatase 67  39 - 117 U/L   Total Bilirubin 0.3  0.3 - 1.2 mg/dL   GFR calc non Af Amer 48 (*) >90 mL/min   GFR calc Af Amer 55 (*) >90 mL/min   Comment: (NOTE)     The eGFR has been calculated using the CKD EPI equation.     This calculation has not been validated in all clinical situations.     eGFR's persistently <90 mL/min signify possible Chronic Kidney     Disease.  PROTIME-INR     Status: None   Collection Time    03/07/13  9:23 PM      Result Value Range   Prothrombin Time 14.0  11.6 - 15.2 seconds   INR 1.10  0.00 - 1.49  URINALYSIS, ROUTINE W REFLEX MICROSCOPIC     Status: None   Collection Time    03/07/13 10:17 PM      Result Value Range   Color, Urine YELLOW  YELLOW   APPearance CLEAR  CLEAR   Specific Gravity, Urine 1.026  1.005 - 1.030   pH 5.0  5.0 - 8.0   Glucose, UA NEGATIVE  NEGATIVE mg/dL   Hgb urine dipstick NEGATIVE  NEGATIVE   Bilirubin Urine NEGATIVE  NEGATIVE   Ketones, ur NEGATIVE  NEGATIVE mg/dL   Protein, ur NEGATIVE  NEGATIVE mg/dL   Urobilinogen, UA 1.0  0.0 - 1.0 mg/dL   Nitrite NEGATIVE  NEGATIVE   Leukocytes, UA NEGATIVE  NEGATIVE   Comment: MICROSCOPIC NOT DONE ON URINES WITH NEGATIVE PROTEIN, BLOOD, LEUKOCYTES, NITRITE, OR GLUCOSE <1000 mg/dL.  SURGICAL PCR SCREEN     Status: None   Collection Time    03/07/13 11:50 PM      Result Value Range   MRSA, PCR NEGATIVE  NEGATIVE   Staphylococcus aureus NEGATIVE  NEGATIVE   Comment:             The Xpert SA Assay (FDA     approved for NASAL specimens     in patients over 51 years of age),     is one component of     a comprehensive surveillance     program.  Test performance has     been validated by [facility] for patients greater     than or equal to 101 year old.     It is not intended     to diagnose infection nor to     guide or monitor treatment.  CBC     Status: Abnormal   Collection Time    03/08/13  5:00 AM      Result Value Range   WBC 6.3  4.0 - 10.5 K/uL   RBC 3.23 (*) 4.22 - 5.81 MIL/uL   Hemoglobin 10.0 (*) 13.0 - 17.0 g/dL   HCT 16.1 (*) 09.6 - 04.5 %   MCV 91.3  78.0 - 100.0 fL   MCH 31.0  26.0 - 34.0 pg   MCHC 33.9  30.0 - 36.0 g/dL   RDW 40.9  81.1 - 91.4 %   Platelets 131 (*) 150 - 400 K/uL  BASIC METABOLIC PANEL     Status: Abnormal   Collection Time    03/08/13  5:00 AM      Result Value Range   Sodium 143  135 - 145 mEq/L  Potassium 4.0  3.5 - 5.1 mEq/L   Chloride 106  96 - 112 mEq/L   CO2 29  19 - 32 mEq/L   Glucose, Bld 121 (*) 70 - 99 mg/dL   BUN 19  6 - 23 mg/dL   Creatinine, Ser 1.61  0.50 - 1.35 mg/dL   Calcium 9.3  8.4 - 09.6 mg/dL   GFR calc non Af Amer 49 (*) >90 mL/min   GFR calc Af Amer 57 (*) >90 mL/min   Comment: (NOTE)     The eGFR has been calculated using the CKD EPI equation.     This calculation has not been validated in all clinical situations.     eGFR's persistently <90 mL/min signify possible Chronic Kidney     Disease.  VITAMIN D 25 HYDROXY     Status: Abnormal   Collection Time    03/08/13  5:00 AM      Result Value Range   Vit D, 25-Hydroxy 18 (*) 30 - 89 ng/mL   Comment: (NOTE)     This assay accurately quantifies Vitamin D, which is the sum of the     25-Hydroxy forms of Vitamin D2 and D3.  Studies have shown that the     optimum concentration of 25-Hydroxy Vitamin D is 30 ng/mL or higher.      Concentrations of Vitamin D between 20 and 29 ng/mL are considered to     be insufficient and  concentrations less than 20 ng/mL are considered     to be deficient for Vitamin D.     Performed at [facility]  CBC WITH DIFFERENTIAL     Status: Abnormal   Collection Time    03/09/13  5:48 AM      Result Value Range   WBC 7.1  4.0 - 10.5 K/uL   RBC 2.82 (*) 4.22 - 5.81 MIL/uL   Hemoglobin 8.7 (*) 13.0 - 17.0 g/dL   HCT 04.5 (*) 40.9 - 81.1 %   MCV 89.7  78.0 - 100.0 fL   MCH 30.9  26.0 - 34.0 pg   MCHC 34.4  30.0 - 36.0 g/dL   RDW 91.4  78.2 - 95.6 %   Platelets 124 (*) 150 - 400 K/uL   Neutrophils Relative % 87 (*) 43 - 77 %   Neutro Abs 6.2  1.7 - 7.7 K/uL   Lymphocytes Relative 4 (*) 12 - 46 %   Lymphs Abs 0.3 (*) 0.7 - 4.0 K/uL   Monocytes Relative 9  3 - 12 %   Monocytes Absolute 0.6  0.1 - 1.0 K/uL   Eosinophils Relative 0  0 - 5 %   Eosinophils Absolute 0.0  0.0 - 0.7 K/uL   Basophils Relative 0  0 - 1 %   Basophils Absolute 0.0  0.0 - 0.1 K/uL  BASIC METABOLIC PANEL     Status: Abnormal   Collection Time    03/09/13  5:48 AM      Result Value Range   Sodium 139  135 - 145 mEq/L   Potassium 3.5  3.5 - 5.1 mEq/L   Chloride 103  96 - 112 mEq/L   CO2 23  19 - 32 mEq/L   Glucose, Bld 115 (*) 70 - 99 mg/dL   BUN 20  6 - 23 mg/dL   Creatinine, Ser 2.13  0.50 - 1.35 mg/dL   Calcium 8.9  8.4 - 08.6 mg/dL   GFR calc non Af Amer 49 (*) >  90 mL/min   GFR calc Af Amer 56 (*) >90 mL/min   Comment: (NOTE)     The eGFR has been calculated using the CKD EPI equation.     This calculation has not been validated in all clinical situations.     eGFR's persistently <90 mL/min signify possible Chronic Kidney     Disease.  TSH     Status: None   Collection Time    03/09/13  5:48 AM      Result Value Range   TSH 0.560  0.350 - 4.500 uIU/mL   Comment: Performed at [facility]  VITAMIN B12     Status: None   Collection Time    03/09/13  5:48 AM      Result Value Range   Vitamin B-12 219  211 - 911 pg/mL   Comment: Performed at [facility]     Studies/Results: Dg Chest 2 View  03/07/2013   *RADIOLOGY REPORT*  Clinical Data: Shortness of breath  CHEST - 2 VIEW  Comparison: 06/29/2012  Findings: The cardiac shadow is stable.  The lungs are clear bilaterally.  No pneumothorax is seen.  No acute bony abnormality is noted.  Degenerative changes of the shoulder joints are seen.  IMPRESSION: No acute abnormality noted.   Original Report Authenticated By: Alcide Clever, M.D.   Dg Hip Complete Right  03/07/2013   *RADIOLOGY REPORT*  Clinical Data: Pain.  RIGHT HIP - COMPLETE 2+ VIEW  Comparison: None.  Findings: There is evidence of acute intertrochanteric fracture of the proximal right femur.  The lesser trochanter is displaced by approximately 7 mm.  The rest the fractures shows fairly mild displacement but does appear extend up through the level of the greater trochanter.  IMPRESSION: Acute intertrochanteric fracture of the hip with relatively mild displacement.   Original Report Authenticated By: Irish Lack, M.D.   Dg Femur Right  03/08/2013   *RADIOLOGY REPORT*  Clinical Data: Right intertrochanteric femur fracture.  DG C-ARM 1-60 MIN,RIGHT FEMUR - 2 VIEW  Technique:  C-arm fluoroscopic images were obtained intraoperatively and submitted for postoperative interpretation. Please see the performing provider's procedural report for the fluoroscopy time utilized.  Comparison:  03/07/2013  Findings: Intraoperative images demonstrate intramedullary nail fixation of the intertrochanteric fracture.  There is a screw extending through the femoral head and neck and an intramedullary nail extending to the distal femur.  Again noted is the displaced lesser trochanter.  IMPRESSION: Internal fixation of the right femur intertrochanteric fracture.   Original Report Authenticated By: Richarda Overlie, M.D.   Dg C-arm 1-60 Min  03/08/2013   *RADIOLOGY REPORT*  Clinical Data: Right intertrochanteric femur fracture.  DG C-ARM 1-60 MIN,RIGHT FEMUR - 2 VIEW   Technique:  C-arm fluoroscopic images were obtained intraoperatively and submitted for postoperative interpretation. Please see the performing provider's procedural report for the fluoroscopy time utilized.  Comparison:  03/07/2013  Findings: Intraoperative images demonstrate intramedullary nail fixation of the intertrochanteric fracture.  There is a screw extending through the femoral head and neck and an intramedullary nail extending to the distal femur.  Again noted is the displaced lesser trochanter.  IMPRESSION: Internal fixation of the right femur intertrochanteric fracture.   Original Report Authenticated By: Richarda Overlie, M.D.   Medications: Scheduled Meds: . amLODipine  5 mg Oral Daily  . aspirin EC  325 mg Oral Q breakfast  . carbidopa-levodopa  1 tablet Oral BID   And  . carbidopa-levodopa  0.5 tablet Oral BID  .  docusate sodium  100 mg Oral BID  . finasteride  5 mg Oral Daily  . irbesartan  150 mg Oral Daily  . polyethylene glycol  17 g Oral Daily  . senna-docusate  1 tablet Oral BID   Continuous Infusions: . sodium chloride 50 mL/hr at 03/09/13 1429  . lactated ringers 50 mL/hr at 03/08/13 1035   PRN Meds:.acetaminophen, acetaminophen, hydrALAZINE, HYDROcodone-acetaminophen, HYDROcodone-acetaminophen, menthol-cetylpyridinium, methocarbamol (ROBAXIN) IV, methocarbamol, metoCLOPramide (REGLAN) injection, metoCLOPramide, morphine injection, morphine injection, ondansetron (ZOFRAN) IV, ondansetron, phenol  Assessment/Plan:  Sundowning - try Risperdal 0.125 mg twice daily and when necessary Ativan 0.5 mg IV or by mouth every 6 hours when necessary    LOS: 2 days   Pearla Dubonnet, MD 03/09/2013, 6:08 PM

## 2013-03-09 NOTE — Progress Notes (Signed)
Subjective: Mr. Dingee had his right hip repaired yesterday.  He is alert and oriented this morning.  Pain is under control.  Feels somewhat thirsty.  No other complaints. Low-grade temperature this morning of 100.2  Objective: Weight change:   Intake/Output Summary (Last 24 hours) at 03/09/13 0739 Last data filed at 03/08/13 1900  Gross per 24 hour  Intake    846 ml  Output      0 ml  Net    846 ml   Filed Vitals:   03/09/13 0000 03/09/13 0100 03/09/13 0400 03/09/13 0641  BP:  152/75  165/90  Pulse:  76  60  Temp:  98.8 F (37.1 C)  100.2 F (37.9 C)  TempSrc:  Oral  Oral  Resp: 14 20 18 20   Height:      Weight:      SpO2: 97% 97% 94% 95%    General Appearance: Alert, cooperative, no distress, appears stated age Lungs: Clear to auscultation bilaterally, respirations unlabored Heart: Regular rate and rhythm, S1 and S2 normal, no murmur, rub or gallop Abdomen: Soft, non-tender, bowel sounds active all four quadrants, no masses, no organomegaly Extremities: Extremities normal, atraumatic, no cyanosis or edema Neuro: Alert, Parkinsonian features, otherwise nonfocal  Lab Results: Results for orders placed during the hospital encounter of 03/07/13 (from the past 48 hour(s))  TYPE AND SCREEN     Status: None   Collection Time    03/07/13  9:21 PM      Result Value Range   ABO/RH(D) O POS     Antibody Screen NEG     Sample Expiration 03/10/2013    ABO/RH     Status: None   Collection Time    03/07/13  9:21 PM      Result Value Range   ABO/RH(D) O POS    CBC WITH DIFFERENTIAL     Status: Abnormal   Collection Time    03/07/13  9:23 PM      Result Value Range   WBC 5.6  4.0 - 10.5 K/uL   RBC 3.50 (*) 4.22 - 5.81 MIL/uL   Hemoglobin 10.9 (*) 13.0 - 17.0 g/dL   HCT 46.9 (*) 62.9 - 52.8 %   MCV 91.1  78.0 - 100.0 fL   MCH 31.1  26.0 - 34.0 pg   MCHC 34.2  30.0 - 36.0 g/dL   RDW 41.3  24.4 - 01.0 %   Platelets 135 (*) 150 - 400 K/uL   Neutrophils Relative % 76  43 -  77 %   Neutro Abs 4.2  1.7 - 7.7 K/uL   Lymphocytes Relative 15  12 - 46 %   Lymphs Abs 0.9  0.7 - 4.0 K/uL   Monocytes Relative 7  3 - 12 %   Monocytes Absolute 0.4  0.1 - 1.0 K/uL   Eosinophils Relative 1  0 - 5 %   Eosinophils Absolute 0.1  0.0 - 0.7 K/uL   Basophils Relative 0  0 - 1 %   Basophils Absolute 0.0  0.0 - 0.1 K/uL  COMPREHENSIVE METABOLIC PANEL     Status: Abnormal   Collection Time    03/07/13  9:23 PM      Result Value Range   Sodium 139  135 - 145 mEq/L   Potassium 3.9  3.5 - 5.1 mEq/L   Chloride 103  96 - 112 mEq/L   CO2 28  19 - 32 mEq/L   Glucose, Bld 115 (*) 70 - 99  mg/dL   BUN 21  6 - 23 mg/dL   Creatinine, Ser 7.84  0.50 - 1.35 mg/dL   Calcium 9.3  8.4 - 69.6 mg/dL   Total Protein 7.0  6.0 - 8.3 g/dL   Albumin 3.6  3.5 - 5.2 g/dL   AST 16  0 - 37 U/L   ALT <5  0 - 53 U/L   Alkaline Phosphatase 67  39 - 117 U/L   Total Bilirubin 0.3  0.3 - 1.2 mg/dL   GFR calc non Af Amer 48 (*) >90 mL/min   GFR calc Af Amer 55 (*) >90 mL/min   Comment: (NOTE)     The eGFR has been calculated using the CKD EPI equation.     This calculation has not been validated in all clinical situations.     eGFR's persistently <90 mL/min signify possible Chronic Kidney     Disease.  PROTIME-INR     Status: None   Collection Time    03/07/13  9:23 PM      Result Value Range   Prothrombin Time 14.0  11.6 - 15.2 seconds   INR 1.10  0.00 - 1.49  URINALYSIS, ROUTINE W REFLEX MICROSCOPIC     Status: None   Collection Time    03/07/13 10:17 PM      Result Value Range   Color, Urine YELLOW  YELLOW   APPearance CLEAR  CLEAR   Specific Gravity, Urine 1.026  1.005 - 1.030   pH 5.0  5.0 - 8.0   Glucose, UA NEGATIVE  NEGATIVE mg/dL   Hgb urine dipstick NEGATIVE  NEGATIVE   Bilirubin Urine NEGATIVE  NEGATIVE   Ketones, ur NEGATIVE  NEGATIVE mg/dL   Protein, ur NEGATIVE  NEGATIVE mg/dL   Urobilinogen, UA 1.0  0.0 - 1.0 mg/dL   Nitrite NEGATIVE  NEGATIVE   Leukocytes, UA NEGATIVE   NEGATIVE   Comment: MICROSCOPIC NOT DONE ON URINES WITH NEGATIVE PROTEIN, BLOOD, LEUKOCYTES, NITRITE, OR GLUCOSE <1000 mg/dL.  SURGICAL PCR SCREEN     Status: None   Collection Time    03/07/13 11:50 PM      Result Value Range   MRSA, PCR NEGATIVE  NEGATIVE   Staphylococcus aureus NEGATIVE  NEGATIVE   Comment:            The Xpert SA Assay (FDA     approved for NASAL specimens     in patients over 84 years of age),     is one component of     a comprehensive surveillance     program.  Test performance has     been validated by [facility] for patients greater     than or equal to 56 year old.     It is not intended     to diagnose infection nor to     guide or monitor treatment.  CBC     Status: Abnormal   Collection Time    03/08/13  5:00 AM      Result Value Range   WBC 6.3  4.0 - 10.5 K/uL   RBC 3.23 (*) 4.22 - 5.81 MIL/uL   Hemoglobin 10.0 (*) 13.0 - 17.0 g/dL   HCT 29.5 (*) 28.4 - 13.2 %   MCV 91.3  78.0 - 100.0 fL   MCH 31.0  26.0 - 34.0 pg   MCHC 33.9  30.0 - 36.0 g/dL   RDW 44.0  10.2 - 72.5 %  Platelets 131 (*) 150 - 400 K/uL  BASIC METABOLIC PANEL     Status: Abnormal   Collection Time    03/08/13  5:00 AM      Result Value Range   Sodium 143  135 - 145 mEq/L   Potassium 4.0  3.5 - 5.1 mEq/L   Chloride 106  96 - 112 mEq/L   CO2 29  19 - 32 mEq/L   Glucose, Bld 121 (*) 70 - 99 mg/dL   BUN 19  6 - 23 mg/dL   Creatinine, Ser 6.96  0.50 - 1.35 mg/dL   Calcium 9.3  8.4 - 29.5 mg/dL   GFR calc non Af Amer 49 (*) >90 mL/min   GFR calc Af Amer 57 (*) >90 mL/min   Comment: (NOTE)     The eGFR has been calculated using the CKD EPI equation.     This calculation has not been validated in all clinical situations.     eGFR's persistently <90 mL/min signify possible Chronic Kidney     Disease.  VITAMIN D 25 HYDROXY     Status: Abnormal   Collection Time    03/08/13  5:00 AM      Result Value Range   Vit D, 25-Hydroxy 18 (*) 30 - 89 ng/mL   Comment: (NOTE)      This assay accurately quantifies Vitamin D, which is the sum of the     25-Hydroxy forms of Vitamin D2 and D3.  Studies have shown that the     optimum concentration of 25-Hydroxy Vitamin D is 30 ng/mL or higher.      Concentrations of Vitamin D between 20 and 29 ng/mL are considered to     be insufficient and concentrations less than 20 ng/mL are considered     to be deficient for Vitamin D.     Performed at [facility]    Studies/Results: Dg Chest 2 View  03/07/2013   *RADIOLOGY REPORT*  Clinical Data: Shortness of breath  CHEST - 2 VIEW  Comparison: 06/29/2012  Findings: The cardiac shadow is stable.  The lungs are clear bilaterally.  No pneumothorax is seen.  No acute bony abnormality is noted.  Degenerative changes of the shoulder joints are seen.  IMPRESSION: No acute abnormality noted.   Original Report Authenticated By: Alcide Clever, M.D.   Dg Hip Complete Right  03/07/2013   *RADIOLOGY REPORT*  Clinical Data: Pain.  RIGHT HIP - COMPLETE 2+ VIEW  Comparison: None.  Findings: There is evidence of acute intertrochanteric fracture of the proximal right femur.  The lesser trochanter is displaced by approximately 7 mm.  The rest the fractures shows fairly mild displacement but does appear extend up through the level of the greater trochanter.  IMPRESSION: Acute intertrochanteric fracture of the hip with relatively mild displacement.   Original Report Authenticated By: Irish Lack, M.D.   Dg Femur Right  03/08/2013   *RADIOLOGY REPORT*  Clinical Data: Right intertrochanteric femur fracture.  DG C-ARM 1-60 MIN,RIGHT FEMUR - 2 VIEW  Technique:  C-arm fluoroscopic images were obtained intraoperatively and submitted for postoperative interpretation. Please see the performing provider's procedural report for the fluoroscopy time utilized.  Comparison:  03/07/2013  Findings: Intraoperative images demonstrate intramedullary nail fixation of the intertrochanteric fracture.  There is a screw  extending through the femoral head and neck and an intramedullary nail extending to the distal femur.  Again noted is the displaced lesser trochanter.  IMPRESSION: Internal fixation of the right femur  intertrochanteric fracture.   Original Report Authenticated By: Richarda Overlie, M.D.   Dg C-arm 1-60 Min  03/08/2013   *RADIOLOGY REPORT*  Clinical Data: Right intertrochanteric femur fracture.  DG C-ARM 1-60 MIN,RIGHT FEMUR - 2 VIEW  Technique:  C-arm fluoroscopic images were obtained intraoperatively and submitted for postoperative interpretation. Please see the performing provider's procedural report for the fluoroscopy time utilized.  Comparison:  03/07/2013  Findings: Intraoperative images demonstrate intramedullary nail fixation of the intertrochanteric fracture.  There is a screw extending through the femoral head and neck and an intramedullary nail extending to the distal femur.  Again noted is the displaced lesser trochanter.  IMPRESSION: Internal fixation of the right femur intertrochanteric fracture.   Original Report Authenticated By: Richarda Overlie, M.D.   Medications: Scheduled Meds: . amLODipine  5 mg Oral Daily  . aspirin EC  325 mg Oral Q breakfast  . carbidopa-levodopa  1 tablet Oral BID   And  . carbidopa-levodopa  0.5 tablet Oral BID  . docusate sodium  100 mg Oral BID  . finasteride  5 mg Oral Daily  . irbesartan  150 mg Oral Daily  . polyethylene glycol  17 g Oral Daily  . senna-docusate  1 tablet Oral BID   Continuous Infusions: . sodium chloride 50 mL/hr at 03/08/13 1502  . lactated ringers 50 mL/hr at 03/08/13 1035   PRN Meds:.acetaminophen, acetaminophen, hydrALAZINE, HYDROcodone-acetaminophen, HYDROcodone-acetaminophen, menthol-cetylpyridinium, methocarbamol (ROBAXIN) IV, methocarbamol, metoCLOPramide (REGLAN) injection, metoCLOPramide, morphine injection, morphine injection, ondansetron (ZOFRAN) IV, ondansetron, phenol  Assessment/Plan: Principal Problem:  Hip fracture   Active Problems:  Parkinson's disease  HTN (hypertension)  Anemia  BPH  FULL CODE  1. Right hip fracture status post mechanical fall - repaired yesterday.  As per orthopedic surgery.  We'll need to skilled nursing facility placement for rehabilitation  2. Hypertension - mildly elevated, resume medications.   3. Anemia and mild thrombocytopenia - CBC pending 4. Parkinson's disease - continue home medications.  5. BPH - continue Proscar 6. Low-grade fever - start incentive spirometry and check urinalysis and CBC 7. Full code 8. Skilled nursing facility for rehabilitation      LOS: 2 days   Pearla Dubonnet, MD 03/09/2013, 7:39 AM

## 2013-03-09 NOTE — Progress Notes (Signed)
Pt has refused vitals this morning. He is being combative toward staff. Nurse tech and RN tried turning pt and pulling him up in the bed but he refused and fought many times. Will continue to monitor

## 2013-03-09 NOTE — Evaluation (Signed)
Physical Therapy Evaluation Patient Details Name: Kevin Hendrix MRN: [id number] DOB: [birth year] Today's Date: 03/09/2013 Time: 4098-1191 PT Time Calculation (min): 25 min  PT Assessment / Plan / Recommendation History of Present Illness  Pt adm after fall and underwent open reduction and internal fixation of right intertrochanteric hip fx. Pt also with Parkinsons.  Clinical Impression  Pt admitted with above. Pt currently with functional limitations due to the deficits listed below (see PT Problem List).  Pt will benefit from skilled PT to increase their independence and safety with mobility to allow discharge to the venue listed below.       PT Assessment  Patient needs continued PT services    Follow Up Recommendations  SNF    Does the patient have the potential to tolerate intense rehabilitation      Barriers to Discharge        Equipment Recommendations  None recommended by PT    Recommendations for Other Services     Frequency Min 4X/week    Precautions / Restrictions Precautions Precautions: Fall Restrictions Weight Bearing Restrictions: Yes RLE Weight Bearing: Weight bearing as tolerated   Pertinent Vitals/Pain See flow sheet.      Mobility  Bed Mobility Bed Mobility: Supine to Sit;Sitting - Scoot to Edge of Bed Supine to Sit: 1: +2 Total assist Supine to Sit: Patient Percentage: 50% Sitting - Scoot to Edge of Bed: 1: +2 Total assist Sitting - Scoot to Edge of Bed: Patient Percentage: 50% Details for Bed Mobility Assistance: Assist to bring trunk up and hips to EOB. Transfers Transfers: Sit to Stand;Stand to Sit Sit to Stand: 1: +2 Total assist;From bed Sit to Stand: Patient Percentage: 60% Stand to Sit: 1: +2 Total assist;With upper extremity assist;With armrests;To chair/3-in-1 Stand to Sit: Patient Percentage: 60% Details for Transfer Assistance: Had pt place hands on walker for sit to stand in order to get pt to lean anteriorly.  Verbal/tactile  cues to place hands on armrests of chair when sitting. Ambulation/Gait Ambulation/Gait Assistance: 1: +2 Total assist Ambulation/Gait: Patient Percentage: 60% Ambulation Distance (Feet): 6 Feet Assistive device: Rolling walker Ambulation/Gait Assistance Details: Verbal/tactile cues for technique.  Assist to advance RLE. Gait Pattern: Step-to pattern;Decreased step length - left;Decreased step length - right;Decreased stance time - right Gait velocity: decr    Exercises     PT Diagnosis: Difficulty walking  PT Problem List: Decreased strength;Decreased activity tolerance;Decreased balance;Decreased mobility;Decreased knowledge of use of DME PT Treatment Interventions: DME instruction;Gait training;Functional mobility training;Therapeutic activities;Therapeutic exercise;Patient/family education;Balance training     PT Goals(Current goals can be found in the care plan section) Acute Rehab PT Goals Patient Stated Goal: Pt didn't state. PT Goal Formulation: With patient Time For Goal Achievement: 03/16/13 Potential to Achieve Goals: Good  Visit Information  Last PT Received On: 03/09/13 Assistance Needed: +2 PT/OT Co-Evaluation/Treatment: Yes History of Present Illness: Pt adm after fall and underwent open reduction and internal fixation of right intertrochanteric hip fx. Pt also with Parkinsons.       Prior Functioning  Home Living Family/patient expects to be discharged to:: Private residence Living Arrangements: Spouse/significant other Available Help at Discharge: Family;Available 24 hours/day Type of Home: House Home Access: Stairs to enter [employer] of Steps: 4 Entrance Stairs-Rails: Can reach both Home Layout: One level Home Equipment: Walker - 2 wheels;Cane - single point;Wheelchair - manual;Bedside commode Prior Function Level of Independence: Needs assistance ADL's / Homemaking Assistance Needed: "Some with bathing and  dressing" Communication Communication: HOH (mumbles) Dominant Hand:  (  both)    Cognition  Cognition Arousal/Alertness: Awake/alert Behavior During Therapy: WFL for tasks assessed/performed Overall Cognitive Status: Within Functional Limits for tasks assessed    Extremity/Trunk Assessment Upper Extremity Assessment Upper Extremity Assessment: Defer to OT evaluation Lower Extremity Assessment Lower Extremity Assessment: RLE deficits/detail RLE Deficits / Details: Needs assist to move against gravity due to fx.   Balance Balance Balance Assessed: Yes Static Sitting Balance Static Sitting - Balance Support: Bilateral upper extremity supported Static Sitting - Level of Assistance: 5: Stand by assistance;3: Mod assist (Pt with posterior lean initially.) Static Standing Balance Static Standing - Balance Support: Bilateral upper extremity supported Static Standing - Level of Assistance: 3: Mod assist  End of Session PT - End of Session Equipment Utilized During Treatment: Gait belt Activity Tolerance: Patient tolerated treatment well Patient left: in chair;with call bell/phone within reach Nurse Communication: Mobility status  GP     Marshia Tropea 03/09/2013, 10:02 AM  Skip Mayer PT [phone]

## 2013-03-09 NOTE — Anesthesia Postprocedure Evaluation (Signed)
Anesthesia Post Note  Patient: Kevin Hendrix  Procedure(s) Performed: Procedure(s) (LRB): INTRAMEDULLARY (IM) NAIL INTERTROCHANTRIC (Right)  Anesthesia type: general  Patient location: PACU  Post pain: Pain level controlled  Post assessment: Patient's Cardiovascular Status Stable  Post vital signs: Reviewed and stable  Level of consciousness: sedated  Complications: No apparent anesthesia complications

## 2013-03-09 NOTE — Clinical Social Work Psychosocial (Signed)
Clinical Social Work Department BRIEF PSYCHOSOCIAL ASSESSMENT 03/09/2013  Patient:  Kevin Hendrix, Kevin Hendrix     Account Number:  [id number]     Admit date:  03/07/2013  Clinical Social Worker:  Lavell Luster  Date/Time:  03/09/2013 03:00 PM  Referred by:  Physician  Date Referred:  03/09/2013 Referred for  SNF Placement   Other Referral:   Interview type:  Other - See comment Other interview type:   Patient and wife were interviewed together. CSW also spoke with daughter Kevin Hendrix.    PSYCHOSOCIAL DATA Living Status:  FAMILY Admitted from facility:   Level of care:   Primary support name:  Kevin Hendrix [phone] Primary support relationship to patient:  SPOUSE Degree of support available:   Support is strong.    CURRENT CONCERNS Current Concerns  Post-Acute Placement   Other Concerns:    SOCIAL WORK ASSESSMENT / PLAN CSW met with patient and wife at bedside to discuss recommendation for SNF placement. Patient states that he does not want SNF if he doesn't need it. Wife was supportive of SNF placement. Patient was agreeable to SNF search process and has a preference for [facility]. CSW also spoke with patient's daughter Kevin Hendrix who states that she is also supportive of SNF placement as she does not feel they can manage patient at home on their own. Patient has [facility], so CSW will submit for authorization from insurance company.   Assessment/plan status:  Psychosocial Support/Ongoing Assessment of Needs Other assessment/ plan:   Complete FL2, PASRR, fax out, submit clinical information to [place].   Information/referral to community resources:   SNF list given to patient and wife for review.    PATIENT'S/FAMILY'S RESPONSE TO PLAN OF CARE: Patient is not optimistic about SNF placement but is willing to look into options. Wife is supportive of the recommendation for SNF and encouraged patient to follow recommendation. Patient awaits bed  offers. Patient and wife were pleasant and appreciative of CSW visit.     Roddie Mc, [place], [place], [phone]

## 2013-03-09 NOTE — Evaluation (Signed)
Occupational Therapy Evaluation Patient Details Name: Kevin Hendrix MRN: [id number] DOB: 1929/04/19 Today's Date: 03/09/2013 Time: 4098-1191 OT Time Calculation (min): 18 min  OT Assessment / Plan / Recommendation History of present illness Pt adm after fall and underwent open reduction and internal fixation of right intertrochanteric hip fx. Pt also with Parkinsons.   Clinical Impression   Pt admitted with above. Pt currently with functional limitations due to the deficits listed below (see OT Problem List). Pt will benefit from skilled OT to increase their safety and independence with ADL and functional mobility for ADL to facilitate discharge to venue listed below.       OT Assessment  Patient needs continued OT Services    Follow Up Recommendations  SNF    Barriers to Discharge Decreased caregiver support    Equipment Recommendations  None recommended by OT       Frequency  Min 2X/week    Precautions / Restrictions Precautions Precautions: Fall Restrictions Weight Bearing Restrictions: Yes RLE Weight Bearing: Weight bearing as tolerated       ADL  Equipment Used: Gait belt;Rolling walker Transfers/Ambulation Related to ADLs: total A +2(pt=60%) sit<>stand and ambulation with RW ADL Comments: Pt currently total A for all BADLs due to lethargy possibly due to pain meds    OT Diagnosis: Generalized weakness  OT Problem List: Decreased strength;Impaired balance (sitting and/or standing);Decreased knowledge of use of DME or AE OT Treatment Interventions: Self-care/ADL training;Balance training;Therapeutic activities;DME and/or AE instruction;Patient/family education   OT Goals(Current goals can be found in the care plan section) Acute Rehab OT Goals Patient Stated Goal: Pt didn't state. OT Goal Formulation: Patient unable to participate in goal setting Time For Goal Achievement: 03/16/13 Potential to Achieve Goals: Good  Visit Information  Last OT Received On:  03/09/13 Assistance Needed: +2 History of Present Illness: Pt adm after fall and underwent open reduction and internal fixation of right intertrochanteric hip fx. Pt also with Parkinsons.       Prior Functioning     Home Living Family/patient expects to be discharged to:: Private residence Living Arrangements: Spouse/significant other Available Help at Discharge: Family;Available 24 hours/day Type of Home: House Home Access: Stairs to enter [employer] of Steps: 4 Entrance Stairs-Rails: Can reach both Home Layout: One level Home Equipment: Walker - 2 wheels;Cane - single point;Wheelchair - manual;Bedside commode Prior Function Level of Independence: Needs assistance ADL's / Homemaking Assistance Needed: "Some with bathing and dressing" Communication Communication: HOH (mumbles) Dominant Hand:  (both)         Vision/Perception Vision - History Baseline Vision: Wears glasses all the time Patient Visual Report: No change from baseline   Cognition  Cognition Arousal/Alertness: Awake/alert Behavior During Therapy:  (Pt sleepy toward end of session probably due to pain meds.) Overall Cognitive Status: Within Functional Limits for tasks assessed    Extremity/Trunk Assessment Upper Extremity Assessment Upper Extremity Assessment: Overall WFL for tasks assessed Lower Extremity Assessment Lower Extremity Assessment: RLE deficits/detail RLE Deficits / Details: Needs assist to move against gravity due to fx.     Mobility Bed Mobility Bed Mobility: Supine to Sit;Sitting - Scoot to Edge of Bed Supine to Sit: 1: +2 Total assist Supine to Sit: Patient Percentage: 50% Sitting - Scoot to Edge of Bed: 1: +2 Total assist Sitting - Scoot to Edge of Bed: Patient Percentage: 50% Details for Bed Mobility Assistance: Assist to bring trunk up and hips to EOB. Transfers Sit to Stand: 1: +2 Total assist;From bed Sit to Stand: Patient  Percentage: 60% Stand to Sit: 1: +2 Total  assist;With upper extremity assist;With armrests;To chair/3-in-1 Stand to Sit: Patient Percentage: 60% Details for Transfer Assistance: Had pt place hands on walker for sit to stand in order to get pt to lean anteriorly.  Verbal/tactile cues to place hands on armrests of chair when sitting.        Balance Balance Balance Assessed: Yes Static Sitting Balance Static Sitting - Balance Support: Bilateral upper extremity supported Static Sitting - Level of Assistance: 5: Stand by assistance;3: Mod assist (Pt with posterior lean initially.) Static Standing Balance Static Standing - Balance Support: Bilateral upper extremity supported Static Standing - Level of Assistance: 3: Mod assist   End of Session OT - End of Session Equipment Utilized During Treatment: Gait belt;Rolling walker Activity Tolerance: Patient limited by lethargy Patient left: in chair;with call bell/phone within reach Nurse Communication: Mobility status       Evette Georges [phone] 03/09/2013, 10:08 AM

## 2013-03-10 LAB — T4, FREE: Free T4: 1.23 ng/dL (ref 0.80–1.80)

## 2013-03-10 LAB — BASIC METABOLIC PANEL
Chloride: 105 mEq/L (ref 96–112)
Creatinine, Ser: 1.25 mg/dL (ref 0.50–1.35)
GFR calc Af Amer: 60 mL/min — ABNORMAL LOW (ref >90)

## 2013-03-10 LAB — CBC WITH DIFFERENTIAL/PLATELET
Basophils Absolute: 0 10*3/uL (ref 0.0–0.1)
Basophils Relative: 0 % (ref 0–1)
HCT: 23.7 % — ABNORMAL LOW (ref 39.0–52.0)
MCHC: 35 g/dL (ref 30.0–36.0)
Monocytes Absolute: 0.8 10*3/uL (ref 0.1–1.0)
Neutro Abs: 5.9 10*3/uL (ref 1.7–7.7)
Neutrophils Relative %: 79 % — ABNORMAL HIGH (ref 43–77)
RDW: 13.8 % (ref 11.5–15.5)

## 2013-03-10 NOTE — Progress Notes (Signed)
Subjective: Patient denies any pain  Objective: Vital signs in last 24 hours: Temp:  [98.4 F (36.9 C)-99.6 F (37.6 C)] 99.6 F (37.6 C) (08/30 0400) Pulse Rate:  [76-119] 94 (08/30 0400) Resp:  [12-22] 22 (08/30 0400) BP: (101-170)/(55-76) 150/63 mmHg (08/30 0400) SpO2:  [94 %-97 %] 94 % (08/30 0400) Weight change:  Last BM Date: 03/09/13  Intake/Output from previous day: 08/29 0701 - 08/30 0700 In: 1080 [P.O.:1080] Out: 325 [Urine:325] Intake/Output this shift:    Resp: clear to auscultation bilaterally Cardio: regular rate and rhythm, S1, S2 normal, no murmur, click, rub or gallop GI: soft, non-tender; bowel sounds normal; no masses,  no organomegaly Extremities: extremities normal, atraumatic, no cyanosis or edema  Lab Results:  Recent Labs  03/09/13 0548 03/10/13 0400  WBC 7.1 7.5  HGB 8.7* 8.3*  HCT 25.3* 23.7*  PLT 124* 115*   BMET  Recent Labs  03/09/13 0548 03/10/13 0400  NA 139 141  K 3.5 3.5  CL 103 105  CO2 23 23  GLUCOSE 115* 118*  BUN 20 23  CREATININE 1.32 1.25  CALCIUM 8.9 8.6    Studies/Results: Dg Femur Right  Mar 30, 2013   *RADIOLOGY REPORT*  Clinical Data: Right intertrochanteric femur fracture.  DG C-ARM 1-60 MIN,RIGHT FEMUR - 2 VIEW  Technique:  C-arm fluoroscopic images were obtained intraoperatively and submitted for postoperative interpretation. Please see the performing provider's procedural report for the fluoroscopy time utilized.  Comparison:  03/07/2013  Findings: Intraoperative images demonstrate intramedullary nail fixation of the intertrochanteric fracture.  There is a screw extending through the femoral head and neck and an intramedullary nail extending to the distal femur.  Again noted is the displaced lesser trochanter.  IMPRESSION: Internal fixation of the right femur intertrochanteric fracture.   Original Report Authenticated By: Richarda Overlie, M.D.   Dg C-arm 1-60 Min  Mar 30, 2013   *RADIOLOGY REPORT*  Clinical Data: Right  intertrochanteric femur fracture.  DG C-ARM 1-60 MIN,RIGHT FEMUR - 2 VIEW  Technique:  C-arm fluoroscopic images were obtained intraoperatively and submitted for postoperative interpretation. Please see the performing provider's procedural report for the fluoroscopy time utilized.  Comparison:  03/07/2013  Findings: Intraoperative images demonstrate intramedullary nail fixation of the intertrochanteric fracture.  There is a screw extending through the femoral head and neck and an intramedullary nail extending to the distal femur.  Again noted is the displaced lesser trochanter.  IMPRESSION: Internal fixation of the right femur intertrochanteric fracture.   Original Report Authenticated By: Richarda Overlie, M.D.    Medications:  Scheduled: . amLODipine  5 mg Oral Daily  . aspirin EC  325 mg Oral Q breakfast  . carbidopa-levodopa  1 tablet Oral BID   And  . carbidopa-levodopa  0.5 tablet Oral BID  . docusate sodium  100 mg Oral BID  . finasteride  5 mg Oral Daily  . irbesartan  150 mg Oral Daily  . polyethylene glycol  17 g Oral Daily  . risperiDONE  0.5 mg Oral BID  . senna-docusate  1 tablet Oral BID    Assessment/Plan: 1. Hip fracture doing well post op minimal pain while in bed 2. Parkinsons, still very bradykinetic 3. Anemia post surgical 8.3 mild decrease  4. Sundowning no evidence this am will d/c resperdol may be exacerbating parkinsons  LOS: 3 days   [facility] 03/10/2013, 7:23 AM

## 2013-03-10 NOTE — Progress Notes (Signed)
Physical Therapy Treatment Patient Details Name: Kevin Hendrix MRN: [id number] DOB: 06/17/1929 Today's Date: 03/10/2013 Time: 2130-8657 PT Time Calculation (min): 26 min  PT Assessment / Plan / Recommendation  History of Present Illness Pt adm after fall and underwent open reduction and internal fixation of right intertrochanteric hip fx. Pt also with Parkinsons.   PT Comments   Pt progressing slowly with mobility. Able to increase gait distance today with assist and cueing. Will continue to follow.   Follow Up Recommendations        Does the patient have the potential to tolerate intense rehabilitation     Barriers to Discharge        Equipment Recommendations       Recommendations for Other Services    Frequency     Progress towards PT Goals Progress towards PT goals: Progressing toward goals  Plan Current plan remains appropriate    Precautions / Restrictions Precautions Precautions: Fall Restrictions RLE Weight Bearing: Weight bearing as tolerated   Pertinent Vitals/Pain No pain when static and unable to rate pain with movement    Mobility  Bed Mobility Supine to Sit: 1: +2 Total assist Supine to Sit: Patient Percentage: 50% Sitting - Scoot to Edge of Bed: 2: Max assist Details for Bed Mobility Assistance: cueing and assist to pivot to EOB, elevate trunk and scoot with pad to side of bed Transfers Sit to Stand: 1: +2 Total assist;From bed Sit to Stand: Patient Percentage: 60% Stand to Sit: 1: +2 Total assist;To chair/3-in-1 Stand to Sit: Patient Percentage: 70% Details for Transfer Assistance: cueing for hand placement, control of descent and anterior weight shift to stand Ambulation/Gait Ambulation/Gait Assistance: 3: Mod assist (+1 for chair for safety) Ambulation Distance (Feet): 14 Feet Assistive device: Rolling walker Ambulation/Gait Assistance Details: max cueing multimodal cueing for hand placement, sequence of stepping, first 10' assist to advance  RLE and last few ft pt able to complete with only cueing. Pt becomes more flexed with fatigue with tactile and verbal cueing to correct and assist to prevent anterior translation beyond BOS Gait Pattern: Step-to pattern;Decreased step length - left;Decreased step length - right;Decreased stance time - right;Trunk flexed Gait velocity: very slow Stairs: No    Exercises General Exercises - Lower Extremity Ankle Circles/Pumps: AROM;Both;10 reps;Seated Heel Slides: AAROM;Right;10 reps;Seated Hip ABduction/ADduction: AAROM;Right;10 reps;Seated   PT Diagnosis:    PT Problem List:   PT Treatment Interventions:     PT Goals (current goals can now be found in the care plan section)    Visit Information  Last PT Received On: 03/10/13 Assistance Needed: +2 History of Present Illness: Pt adm after fall and underwent open reduction and internal fixation of right intertrochanteric hip fx. Pt also with Parkinsons.    Subjective Data      Cognition  Cognition Arousal/Alertness: Awake/alert Behavior During Therapy: Flat affect Overall Cognitive Status: Impaired/Different from baseline Area of Impairment: Orientation Orientation Level: Time General Comments: slow processing    Balance     End of Session PT - End of Session Equipment Utilized During Treatment: Gait belt Activity Tolerance: Patient tolerated treatment well Patient left: in chair;with call bell/phone within reach Nurse Communication: Mobility status   GP     Delorse Lek 03/10/2013, 12:47 PM Delaney Meigs, PT [phone]

## 2013-03-10 NOTE — Progress Notes (Signed)
Subjective:  Patient reports pain as none.  No events  Objective:   VITALS:   Filed Vitals:   03/09/13 2218 03/09/13 2324 03/10/13 0400 03/10/13 0938  BP: 152/55  150/63 135/65  Pulse: 119  94 85  Temp: 98.6 F (37 C)  99.6 F (37.6 C) 98.8 F (37.1 C)  TempSrc: Oral  Oral Oral  Resp: 18 18 22 20   Height:      Weight:      SpO2: 96% 96% 94% 98%    Neurologically intact Neurovascular intact Sensation intact distally Intact pulses distally Dorsiflexion/Plantar flexion intact Incision: dressing C/D/I No cellulitis present Compartment soft   Lab Results  Component Value Date   WBC 7.5 03/10/2013   HGB 8.3* 03/10/2013   HCT 23.7* 03/10/2013   MCV 87.8 03/10/2013   PLT 115* 03/10/2013     Assessment/Plan: 2 Days Post-Op   Principal Problem:   Hip fracture Active Problems:   Parkinson's disease   HTN (hypertension)   Anemia   Up with therapy Placement per primary team   Cheral Almas 03/10/2013, 10:20 AM

## 2013-03-11 NOTE — Progress Notes (Signed)
Patient on a turn q2 hour schedule thru shift. Patient received linen change and bath. Two foam dressings to right hip with small amount of discharge noted one one dressing.

## 2013-03-11 NOTE — Progress Notes (Signed)
Subjective: Alert complains of hip pain only with movement  Objective: Vital signs in last 24 hours: Temp:  [98.6 F (37 C)-99.4 F (37.4 C)] 99.4 F (37.4 C) (08/31 0600) Pulse Rate:  [66-104] 104 (08/31 0600) Resp:  [16-20] 18 (08/31 0600) BP: (90-156)/(50-77) 154/77 mmHg (08/31 0600) SpO2:  [96 %-98 %] 96 % (08/31 0600) Weight change:  Last BM Date: 03/09/13  Intake/Output from previous day: 08/30 0701 - 08/31 0700 In: 1185 [P.O.:420; I.V.:765] Out: 850 [Urine:850] Intake/Output this shift:    Neurologic: Mental status: Alert, oriented, thought content appropriate, alert orient to place and situation, speach a little stronger  Lab Results:  Recent Labs  03/09/13 0548 03/10/13 0400  WBC 7.1 7.5  HGB 8.7* 8.3*  HCT 25.3* 23.7*  PLT 124* 115*   BMET  Recent Labs  03/09/13 0548 03/10/13 0400  NA 139 141  K 3.5 3.5  CL 103 105  CO2 23 23  GLUCOSE 115* 118*  BUN 20 23  CREATININE 1.32 1.25  CALCIUM 8.9 8.6    Studies/Results: No results found.  Medications:  Scheduled: . amLODipine  5 mg Oral Daily  . aspirin EC  325 mg Oral Q breakfast  . carbidopa-levodopa  1 tablet Oral BID   And  . carbidopa-levodopa  0.5 tablet Oral BID  . docusate sodium  100 mg Oral BID  . finasteride  5 mg Oral Daily  . irbesartan  150 mg Oral Daily  . polyethylene glycol  17 g Oral Daily  . senna-docusate  1 tablet Oral BID    Assessment/Plan: 1. Htn fair control 2. Anemia will check cbc in am 3. Parkinsons a little stronger off resperidol and no behavioral issues 4. S/p hip frx continue pt   LOS: 4 days   [facility] 03/11/2013, 7:17 AM

## 2013-03-12 LAB — CBC WITH DIFFERENTIAL/PLATELET
Basophils Relative: 0 % (ref 0–1)
HCT: 20.8 % — ABNORMAL LOW (ref 39.0–52.0)
Hemoglobin: 7.1 g/dL — ABNORMAL LOW (ref 13.0–17.0)
Lymphocytes Relative: 20 % (ref 12–46)
MCHC: 34.1 g/dL (ref 30.0–36.0)
Monocytes Absolute: 0.4 10*3/uL (ref 0.1–1.0)
Monocytes Relative: 10 % (ref 3–12)
Neutro Abs: 2.6 10*3/uL (ref 1.7–7.7)

## 2013-03-12 MED ORDER — ALUM & MAG HYDROXIDE-SIMETH 200-200-20 MG/5ML PO SUSP: 15.0000 mL | Freq: Once | ORAL | Status: DC

## 2013-03-12 MED ORDER — ALUM & MAG HYDROXIDE-SIMETH 200-200-20 MG/5ML PO SUSP
15.0000 mL | Freq: Once | ORAL | Status: AC
  Administered 2013-03-12: 15 mL via ORAL
  Filled 2013-03-12: qty 30

## 2013-03-12 NOTE — Progress Notes (Signed)
Physical Therapy Treatment Patient Details Name: Kevin Hendrix MRN: [id number] DOB: [birth year] Today's Date: 03/12/2013 Time: 0840-0900 PT Time Calculation (min): 20 min  PT Assessment / Plan / Recommendation  History of Present Illness Pt adm after fall and underwent open reduction and internal fixation of right intertrochanteric hip fx. Pt also with Parkinsons.   PT Comments   Making progress with mobility, especially activity tolerance and ambulation distance  Follow Up Recommendations  SNF     Does the patient have the potential to tolerate intense rehabilitation     Barriers to Discharge        Equipment Recommendations  None recommended by PT    Recommendations for Other Services    Frequency Min 4X/week   Progress towards PT Goals Progress towards PT goals: Progressing toward goals  Plan Current plan remains appropriate    Precautions / Restrictions Precautions Precautions: Fall Restrictions RLE Weight Bearing: Weight bearing as tolerated   Pertinent Vitals/Pain R hip pain6-7/10 patient repositioned for comfort Was given pain meds prior to session    Mobility  Bed Mobility Bed Mobility: Supine to Sit;Sitting - Scoot to Edge of Bed Supine to Sit: 3: Mod assist;HOB elevated;With rails Sitting - Scoot to Edge of Bed: 3: Mod assist;With rail Details for Bed Mobility Assistance: cueing and assist to pivot to EOB, elevate trunk and scoot with pad to side of bed; Needing less assist than last session Transfers Transfers: Sit to Stand;Stand to Sit Sit to Stand: From bed;3: Mod assist Stand to Sit: 4: Min assist;To chair/3-in-1 Details for Transfer Assistance: cueing for hand placement, control of descent and anterior weight shift to stand; Pt initiated sit to stand entirely on his own, but braced LEs against bed for stability with significant posterior lean; mod assist to anteriorly weight shift into RW; Hand-over hand assist to find armrests on chair with stand to  sit Ambulation/Gait Ambulation/Gait Assistance: 3: Mod assist Ambulation Distance (Feet): 60 Feet Assistive device: Rolling walker Ambulation/Gait Assistance Details: very short steps, but better management/keeping center of mass over feet instead of too far anteriorly; Required mod assist to safely advance RW Gait Pattern: Step-to pattern;Decreased step length - left;Decreased step length - right;Decreased stance time - right;Trunk flexed Gait velocity: very slow    Exercises     PT Diagnosis:    PT Problem List:   PT Treatment Interventions:     PT Goals (current goals can now be found in the care plan section) Acute Rehab PT Goals PT Goal Formulation: With patient Time For Goal Achievement: 03/16/13 Potential to Achieve Goals: Good  Visit Information  Last PT Received On: 03/12/13 Assistance Needed:  (+2 helpful to push chair, especially if connected to IV) History of Present Illness: Pt adm after fall and underwent open reduction and internal fixation of right intertrochanteric hip fx. Pt also with Parkinsons.    Subjective Data  Subjective: Really wanting to get to chair    Cognition  Cognition Arousal/Alertness: Awake/alert Behavior During Therapy: Flat affect Overall Cognitive Status: Within Functional Limits for tasks assessed General Comments: slow processing    Balance     End of Session PT - End of Session Activity Tolerance: Patient tolerated treatment well Patient left: in chair;with call bell/phone within reach Nurse Communication: Mobility status   GP     Van Clines [facility], [place] [phone]  03/12/2013, 12:21 PM

## 2013-03-12 NOTE — Progress Notes (Signed)
Subjective: Patient pleasant, no apparent distress. No problems per nursing. Hemoglobin continues to trend down. Patient denies chest pain shortness of breath fatigue  Objective: Vital signs in last 24 hours: Temp:  [98 F (36.7 C)-98.8 F (37.1 C)] 98.8 F (37.1 C) (09/01 1051) Pulse Rate:  [67-77] 77 (09/01 1051) Resp:  [16-20] 16 (09/01 1051) BP: (123-148)/(49-80) 124/49 mmHg (09/01 1051) SpO2:  [92 %-100 %] 100 % (09/01 1051) Weight change:  Last BM Date: 03/09/13  Intake/Output from previous day: 08/31 0701 - 09/01 0700 In: 732.5 [I.V.:732.5] Out: 650 [Urine:650] Intake/Output this shift:    Resp: clear to auscultation bilaterally Cardio: regular rate and rhythm, S1, S2 normal, no murmur, click, rub or gallop Extremities: Right hip dressing intact, there is swelling, there is no pain no tenderness no drainage  Lab Results:  Results for orders placed during the hospital encounter of 03/07/13 (from the past 24 hour(s))  CBC WITH DIFFERENTIAL     Status: Abnormal   Collection Time    03/12/13  5:45 AM      Result Value Range   WBC 4.0  4.0 - 10.5 K/uL   RBC 2.30 (*) 4.22 - 5.81 MIL/uL   Hemoglobin 7.1 (*) 13.0 - 17.0 g/dL   HCT 16.1 (*) 09.6 - 04.5 %   MCV 90.4  78.0 - 100.0 fL   MCH 30.9  26.0 - 34.0 pg   MCHC 34.1  30.0 - 36.0 g/dL   RDW 40.9  81.1 - 91.4 %   Platelets 138 (*) 150 - 400 K/uL   Neutrophils Relative % 64  43 - 77 %   Neutro Abs 2.6  1.7 - 7.7 K/uL   Lymphocytes Relative 20  12 - 46 %   Lymphs Abs 0.8  0.7 - 4.0 K/uL   Monocytes Relative 10  3 - 12 %   Monocytes Absolute 0.4  0.1 - 1.0 K/uL   Eosinophils Relative 5  0 - 5 %   Eosinophils Absolute 0.2  0.0 - 0.7 K/uL   Basophils Relative 0  0 - 1 %   Basophils Absolute 0.0  0.0 - 0.1 K/uL      Studies/Results: No results found.  Medications:  Prior to Admission:  Prescriptions prior to admission  Medication Sig Dispense Refill  . AMLODIPINE BESYLATE PO Take 5 mg by mouth daily.      Marland Kitchen  aspirin 81 MG tablet Take 81 mg by mouth daily.      . carbidopa-levodopa (SINEMET) 25-100 MG per tablet Take 0.5-1 tablets by mouth 4 (four) times daily. Pt takes this medication at 1 tablet at 8 am, 0.5 tablet at 11 am,  1 tablet at 4 pm,  0.5 tablet at 7 pm      . finasteride (PROSCAR) 5 MG tablet Take 5 mg by mouth daily.      . furosemide (LASIX) 40 MG tablet Take 40 mg by mouth daily.       Marland Kitchen olmesartan (BENICAR) 20 MG tablet Take 20 mg by mouth daily.      . potassium chloride SA (K-DUR,KLOR-CON) 20 MEQ tablet Take 20 mEq by mouth as needed (leg swelling).       Scheduled: . amLODipine  5 mg Oral Daily  . aspirin EC  325 mg Oral Q breakfast  . carbidopa-levodopa  1 tablet Oral BID   And  . carbidopa-levodopa  0.5 tablet Oral BID  . docusate sodium  100 mg Oral BID  . finasteride  5  mg Oral Daily  . irbesartan  150 mg Oral Daily  . polyethylene glycol  17 g Oral Daily  . senna-docusate  1 tablet Oral BID   Continuous: . sodium chloride 50 mL/hr at 03/12/13 0836  . lactated ringers 50 mL/hr at 03/08/13 1035    Assessment/Plan: Elderly male with Parkinson's hypertension admitted to the hospital status post mechanical fall with resultant right hip fracture. Patient's course complicated by drop in hemoglobin, he is asymptomatic. He is still receiving IV fluids, we will stop IV fluids check followup hemoglobin if persistent drop transfusion will be indicated.  LOS: 5 days   Kevin Hendrix D 03/12/2013, 12:12 PM

## 2013-03-12 NOTE — Progress Notes (Signed)
Subjective: 4 Days Post-Op Procedure(s) (LRB): INTRAMEDULLARY (IM) NAIL INTERTROCHANTRIC (Right) Patient reports pain as mild.  Very slow progress with therapy.  Hgb down to 7.1 (acute blood loss anemia), but vitals stable.  Objective: Vital signs in last 24 hours: Temp:  [98 F (36.7 C)-98.8 F (37.1 C)] 98.8 F (37.1 C) (09/01 0623) Pulse Rate:  [67-75] 68 (09/01 0623) Resp:  [16-20] 16 (09/01 0623) BP: (123-148)/(70-80) 148/70 mmHg (09/01 0623) SpO2:  [92 %-98 %] 96 % (09/01 0623)  Intake/Output from previous day: 08/31 0701 - 09/01 0700 In: 732.5 [I.V.:732.5] Out: 650 [Urine:650] Intake/Output this shift:     Recent Labs  03/10/13 0400 03/12/13 0545  HGB 8.3* 7.1*    Recent Labs  03/10/13 0400 03/12/13 0545  WBC 7.5 4.0  RBC 2.70* 2.30*  HCT 23.7* 20.8*  PLT 115* 138*    Recent Labs  03/10/13 0400  NA 141  K 3.5  CL 105  CO2 23  BUN 23  CREATININE 1.25  GLUCOSE 118*  CALCIUM 8.6   No results found for this basename: LABPT, INR,  in the last 72 hours  Sensation intact distally Intact pulses distally Dorsiflexion/Plantar flexion intact Incision: dressing C/D/I No cellulitis present  Assessment/Plan: 4 Days Post-Op Procedure(s) (LRB): INTRAMEDULLARY (IM) NAIL INTERTROCHANTRIC (Right) Up with therapy May need transfusion. No DVT meds needed from Ortho standpoint due to bleeding risk and fall risk.  Would only try aspirin if meds needed from primary team standpoint.  Harshika Mago Y 03/12/2013, 9:19 AM

## 2013-03-12 NOTE — Clinical Social Work Placement (Signed)
Clinical Social Work Department CLINICAL SOCIAL WORK PLACEMENT NOTE 03/12/2013  Patient:  Kevin Hendrix, Kevin Hendrix  Account Number:  [id number] Admit date:  03/07/2013  Clinical Social Worker:  Cherre Blanc, [place]  Date/time:  03/09/2013 04:00 PM  Clinical Social Work is seeking post-discharge placement for this patient at the following level of care:   SKILLED NURSING   (*CSW will update this form in Epic as items are completed)   03/09/2013  Patient/family provided with [facility] Department of Clinical Social Work's list of facilities offering this level of care within the geographic area requested by the patient (or if unable, by the patient's family).  03/09/2013  Patient/family informed of their freedom to choose among providers that offer the needed level of care, that participate in Medicare, Medicaid or managed care program needed by the patient, have an available bed and are willing to accept the patient.  03/09/2013  Patient/family informed of [facility]' ownership interest in [facility], as well as of the fact that they are under no obligation to receive care at this facility.  PASARR submitted to [employer] on 03/09/2013 PASARR number received from [employer] on 03/09/2013  FL2 transmitted to all facilities in geographic area requested by pt/family on  03/09/2013 FL2 transmitted to all facilities within larger geographic area on   Patient informed that his/her managed care company has contracts with or will negotiate with  certain facilities, including the following:     Patient/family informed of bed offers received:   Patient chooses bed at  Physician recommends and patient chooses bed at    Patient to be transferred to  on   Patient to be transferred to facility by   The following physician request were entered in Epic:   Additional Comments:   Roddie Mc, [place], [place], [phone]

## 2013-03-13 LAB — CBC
HCT: 22.6 % — ABNORMAL LOW (ref 39.0–52.0)
Hemoglobin: 7.8 g/dL — ABNORMAL LOW (ref 13.0–17.0)
MCH: 31.1 pg (ref 26.0–34.0)
MCHC: 34.5 g/dL (ref 30.0–36.0)

## 2013-03-13 LAB — GLUCOSE, CAPILLARY: Glucose-Capillary: 136 mg/dL — ABNORMAL HIGH (ref 70–99)

## 2013-03-13 MED ORDER — POLYETHYLENE GLYCOL 3350 17 G PO PACK: 17.0000 g | PACK | Freq: Every day | ORAL | Status: DC

## 2013-03-13 MED ORDER — HYDROCODONE-ACETAMINOPHEN 5-325 MG PO TABS: 1.0000 | ORAL_TABLET | Freq: Four times a day (QID) | ORAL | Status: DC | PRN

## 2013-03-13 MED ORDER — FUROSEMIDE 40 MG PO TABS: 20.0000 mg | ORAL_TABLET | Freq: Every day | ORAL | Status: AC

## 2013-03-13 MED ORDER — METHOCARBAMOL 500 MG PO TABS: 500.0000 mg | ORAL_TABLET | Freq: Four times a day (QID) | ORAL | Status: DC | PRN

## 2013-03-13 MED ORDER — LORAZEPAM 0.5 MG PO TABS: 0.5000 mg | ORAL_TABLET | ORAL | Status: DC | PRN

## 2013-03-13 MED ORDER — LORAZEPAM 2 MG/ML IJ SOLN: 0.5000 mg | INTRAMUSCULAR | Status: DC | PRN

## 2013-03-13 MED ORDER — POTASSIUM CHLORIDE CRYS ER 20 MEQ PO TBCR: 10.0000 meq | EXTENDED_RELEASE_TABLET | Freq: Every day | ORAL | Status: DC

## 2013-03-13 MED ORDER — DSS 100 MG PO CAPS: 100.0000 mg | ORAL_CAPSULE | Freq: Two times a day (BID) | ORAL | Status: DC

## 2013-03-13 MED ORDER — ASPIRIN 325 MG PO TBEC: 325.0000 mg | DELAYED_RELEASE_TABLET | Freq: Every day | ORAL | Status: DC

## 2013-03-13 NOTE — Progress Notes (Signed)
Occupational Therapy Treatment Patient Details Name: Adonus Uselman MRN: [id number] DOB: 05-04-1929 Today's Date: 03/13/2013 Time: 1478-2956 OT Time Calculation (min): 30 min  OT Assessment / Plan / Recommendation  History of present illness Pt adm after fall and underwent open reduction and internal fixation of right intertrochanteric hip fx. Pt also with Parkinsons.   OT comments  Progressing in mobility for toilet transfers. Requires extra time for all ADL and mobility. Pt continues to need SNF for rehab prior to return home as he is dependent in bathing, dressing and toileting and is a high fall risk.  Follow Up Recommendations  SNF    Barriers to Discharge       Equipment Recommendations  None recommended by OT    Recommendations for Other Services    Frequency Min 2X/week   Progress towards OT Goals Progress towards OT goals: Progressing toward goals  Plan Discharge plan remains appropriate    Precautions / Restrictions Precautions Precautions: Fall Restrictions Weight Bearing Restrictions: No RLE Weight Bearing: Weight bearing as tolerated   Pertinent Vitals/Pain No c/o pain, VSS    ADL  Grooming: Wash/dry hands;Wash/dry face;Set up Where Assessed - Grooming: Unsupported sitting Toilet Transfer: Minimal assistance Toilet Transfer Method: Stand pivot Toilet Transfer Equipment: Bedside commode Toileting - Clothing Manipulation and Hygiene: +1 Total assistance Where Assessed - Toileting Clothing Manipulation and Hygiene: Standing Equipment Used: Gait belt;Rolling walker Transfers/Ambulation Related to ADLs: min assist with RW and extra time ADL Comments: Pt with bowel incontinence.    OT Diagnosis:    OT Problem List:   OT Treatment Interventions:     OT Goals(current goals can now be found in the care plan section)    Visit Information  Last OT Received On: 03/13/13 History of Present Illness: Pt adm after fall and underwent open reduction and internal  fixation of right intertrochanteric hip fx. Pt also with Parkinsons.    Subjective Data      Prior Functioning       Cognition  Cognition Arousal/Alertness: Awake/alert Behavior During Therapy: Flat affect Overall Cognitive Status: Within Functional Limits for tasks assessed General Comments: slow processing    Mobility  Bed Mobility Bed Mobility: Supine to Sit;Sitting - Scoot to Edge of Bed Supine to Sit: 3: Mod assist;HOB elevated;With rails Sitting - Scoot to Edge of Bed: 3: Mod assist;With rail Details for Bed Mobility Assistance: cues and assist for technique, use of rail and pad Transfers Transfers: Sit to Stand;Stand to Sit Sit to Stand: 4: Min assist;With upper extremity assist;From bed;From chair/3-in-1 Stand to Sit: 4: Min assist;With upper extremity assist;To bed;To chair/3-in-1 Details for Transfer Assistance: cueing for hand placement, control of descent and anterior weight shift to stand; Pt initiated sit to stand entirely on his own, but braced LEs against bed for stability with significant posterior lean; mod assist to anteriorly weight shift into RW; Hand-over hand assist to find armrests on chair with stand to sit    Exercises      Balance     End of Session OT - End of Session Equipment Utilized During Treatment: Gait belt;Rolling walker Activity Tolerance: Patient tolerated treatment well Patient left: in chair;with call bell/phone within reach Nurse Communication:  (condom cath off)  GO     Evern Bio 03/13/2013, 12:27 PM [phone]

## 2013-03-13 NOTE — Progress Notes (Signed)
Patient's wife request to speak to surgeon. Patient's wife states she has not got to have a conversation with the doctor since surgery. She is in a wheelchair and will be here at noon. She will be here between noon and 1930. Patient's wife was unaware of transfer to SNF. She questions where? When? And would like to have input.

## 2013-03-13 NOTE — Discharge Summary (Signed)
Physician Discharge Summary  NAME:Kevin Hendrix  ZOX:[id number]  DOB: 05/27/1929   Admit date: 03/07/2013 Discharge date: 03/13/2013  Discharge Diagnoses:  Principal Problem:   Hip fracture - ambulating already, ready for SNF placement for rehabilitation Active Problems:   Parkinson's disease - stable   HTN (hypertension) - stable   Anemia - hemoglobin up to 7.8 from 7.1. Will treat with iron therapy at skilled nursing facility   Discharge Physical Exam:  General Appearance: Alert, cooperative, no distress, appears stated age  Weight change:   Intake/Output Summary (Last 24 hours) at 03/13/13 8119 Last data filed at 03/13/13 0529  Gross per 24 hour  Intake    250 ml  Output    925 ml  Net   -675 ml   Filed Vitals:   03/12/13 2332 03/13/13 0214 03/13/13 0307 03/13/13 0528  BP:  149/76  145/65  Pulse:  75  71  Temp:  98.4 F (36.9 C)  98.7 F (37.1 C)  TempSrc:  Oral  Oral  Resp: 16 17 16 16   Height:      Weight:      SpO2:  94%  95%   General Appearance: Alert, cooperative, no distress, appears stated age  Lungs: Clear to auscultation bilaterally, respirations unlabored  Heart: Regular rate and rhythm, S1 and S2 normal, no murmur, rub or gallop  Abdomen: Soft, non-tender, bowel sounds active all four quadrants, no masses, no organomegaly  Extremities: status post right hip fracture repair Neuro: Alert, Parkinsonian features, otherwise nonfocal  Discharge Condition: improved  Hospital Course: Mr. Dow Blahnik is a very pleasant 79 year old male with a history of hypertension and Parkinson's disease. Unfortunately he had a fall at his house and fractured his right hip. This has been repaired. He has had some postoperative anemia and confusion but doing well overall. Plans are to transfer to skilled nursing facility for further rehabilitation prior to returning home. Very supportive family  Things to follow up in the outpatient setting: Followup on anemia and  continue Parkinson's medications and blood pressure medications and monitor for lower extremity edema and hypokalemia. Followup on hemoglobin in one week for postoperative anemia and start iron therapy  Consults: Treatment Team:  Kathryne Hitch, MD  Disposition: 01-Home or Self Care  Discharge Orders   Future Orders Complete By Expires   Call MD for:  difficulty breathing, headache or visual disturbances  As directed    Call MD for:  persistant nausea and vomiting  As directed    Call MD for:  severe uncontrolled pain  As directed    Call MD for:  temperature >100.4  As directed    Diet - low sodium heart healthy  As directed    Increase activity slowly  As directed        Medication List    STOP taking these medications       aspirin 81 MG tablet  Replaced by:  aspirin 325 MG EC tablet      TAKE these medications       AMLODIPINE BESYLATE PO  Take 5 mg by mouth daily.     aspirin 325 MG EC tablet  Take 1 tablet (325 mg total) by mouth daily with breakfast.     carbidopa-levodopa 25-100 MG per tablet  Commonly known as:  SINEMET IR  Take 0.5-1 tablets by mouth 4 (four) times daily. Pt takes this medication at 1 tablet at 8 am, 0.5 tablet at 11 am,  1 tablet at 4 pm,  0.5 tablet at 7 pm     DSS 100 MG Caps  Take 100 mg by mouth 2 (two) times daily.     finasteride 5 MG tablet  Commonly known as:  PROSCAR  Take 5 mg by mouth daily.     furosemide 40 MG tablet  Commonly known as:  LASIX  Take 0.5 tablets (20 mg total) by mouth daily.     HYDROcodone-acetaminophen 5-325 MG per tablet  Commonly known as:  NORCO/VICODIN  Take 1 tablet by mouth every 6 (six) hours as needed.     LORazepam 0.5 MG tablet  Commonly known as:  ATIVAN  Take 1 tablet (0.5 mg total) by mouth every 4 (four) hours as needed for anxiety (for agitation/sundowning).     LORazepam 2 MG/ML injection  Commonly known as:  ATIVAN  Inject 0.25 mLs (0.5 mg total) into the muscle every 4  (four) hours as needed for anxiety.     methocarbamol 500 MG tablet  Commonly known as:  ROBAXIN  Take 1 tablet (500 mg total) by mouth every 6 (six) hours as needed.     olmesartan 20 MG tablet  Commonly known as:  BENICAR  Take 20 mg by mouth daily.     polyethylene glycol packet  Commonly known as:  MIRALAX / GLYCOLAX  Take 17 g by mouth daily.     potassium chloride SA 20 MEQ tablet  Commonly known as:  K-DUR,KLOR-CON  Take 0.5 tablets (10 mEq total) by mouth daily.         The results of significant diagnostics from this hospitalization (including imaging, microbiology, ancillary and laboratory) are listed below for reference.    Significant Diagnostic Studies: Dg Chest 2 View  03/07/2013   *RADIOLOGY REPORT*  Clinical Data: Shortness of breath  CHEST - 2 VIEW  Comparison: 06/29/2012  Findings: The cardiac shadow is stable.  The lungs are clear bilaterally.  No pneumothorax is seen.  No acute bony abnormality is noted.  Degenerative changes of the shoulder joints are seen.  IMPRESSION: No acute abnormality noted.   Original Report Authenticated By: Alcide Clever, M.D.   Dg Hip Complete Right  03/07/2013   *RADIOLOGY REPORT*  Clinical Data: Pain.  RIGHT HIP - COMPLETE 2+ VIEW  Comparison: None.  Findings: There is evidence of acute intertrochanteric fracture of the proximal right femur.  The lesser trochanter is displaced by approximately 7 mm.  The rest the fractures shows fairly mild displacement but does appear extend up through the level of the greater trochanter.  IMPRESSION: Acute intertrochanteric fracture of the hip with relatively mild displacement.   Original Report Authenticated By: Irish Lack, M.D.   Dg Femur Right  03/08/2013   *RADIOLOGY REPORT*  Clinical Data: Right intertrochanteric femur fracture.  DG C-ARM 1-60 MIN,RIGHT FEMUR - 2 VIEW  Technique:  C-arm fluoroscopic images were obtained intraoperatively and submitted for postoperative interpretation. Please  see the performing provider's procedural report for the fluoroscopy time utilized.  Comparison:  03/07/2013  Findings: Intraoperative images demonstrate intramedullary nail fixation of the intertrochanteric fracture.  There is a screw extending through the femoral head and neck and an intramedullary nail extending to the distal femur.  Again noted is the displaced lesser trochanter.  IMPRESSION: Internal fixation of the right femur intertrochanteric fracture.   Original Report Authenticated By: Richarda Overlie, M.D.   Dg C-arm 1-60 Min  03/08/2013   *RADIOLOGY REPORT*  Clinical Data: Right intertrochanteric femur fracture.  DG C-ARM 1-60 MIN,RIGHT FEMUR -  2 VIEW  Technique:  C-arm fluoroscopic images were obtained intraoperatively and submitted for postoperative interpretation. Please see the performing provider's procedural report for the fluoroscopy time utilized.  Comparison:  03/07/2013  Findings: Intraoperative images demonstrate intramedullary nail fixation of the intertrochanteric fracture.  There is a screw extending through the femoral head and neck and an intramedullary nail extending to the distal femur.  Again noted is the displaced lesser trochanter.  IMPRESSION: Internal fixation of the right femur intertrochanteric fracture.   Original Report Authenticated By: Richarda Overlie, M.D.    Microbiology: Recent Results (from the past 240 hour(s))  SURGICAL PCR SCREEN     Status: None   Collection Time    03/07/13 11:50 PM      Result Value Range Status   MRSA, PCR NEGATIVE  NEGATIVE Final   Staphylococcus aureus NEGATIVE  NEGATIVE Final   Comment:            The Xpert SA Assay (FDA     approved for NASAL specimens     in patients over 41 years of age),     is one component of     a comprehensive surveillance     program.  Test performance has     been validated by [employer] for patients greater     than or equal to 54 year old.     It is not intended     to diagnose infection nor to      guide or monitor treatment.     Labs: Results for orders placed during the hospital encounter of 03/07/13  SURGICAL PCR SCREEN      Result Value Range   MRSA, PCR NEGATIVE  NEGATIVE   Staphylococcus aureus NEGATIVE  NEGATIVE  CBC WITH DIFFERENTIAL      Result Value Range   WBC 5.6  4.0 - 10.5 K/uL   RBC 3.50 (*) 4.22 - 5.81 MIL/uL   Hemoglobin 10.9 (*) 13.0 - 17.0 g/dL   HCT 16.1 (*) 09.6 - 04.5 %   MCV 91.1  78.0 - 100.0 fL   MCH 31.1  26.0 - 34.0 pg   MCHC 34.2  30.0 - 36.0 g/dL   RDW 40.9  81.1 - 91.4 %   Platelets 135 (*) 150 - 400 K/uL   Neutrophils Relative % 76  43 - 77 %   Neutro Abs 4.2  1.7 - 7.7 K/uL   Lymphocytes Relative 15  12 - 46 %   Lymphs Abs 0.9  0.7 - 4.0 K/uL   Monocytes Relative 7  3 - 12 %   Monocytes Absolute 0.4  0.1 - 1.0 K/uL   Eosinophils Relative 1  0 - 5 %   Eosinophils Absolute 0.1  0.0 - 0.7 K/uL   Basophils Relative 0  0 - 1 %   Basophils Absolute 0.0  0.0 - 0.1 K/uL  COMPREHENSIVE METABOLIC PANEL      Result Value Range   Sodium 139  135 - 145 mEq/L   Potassium 3.9  3.5 - 5.1 mEq/L   Chloride 103  96 - 112 mEq/L   CO2 28  19 - 32 mEq/L   Glucose, Bld 115 (*) 70 - 99 mg/dL   BUN 21  6 - 23 mg/dL   Creatinine, Ser 7.82  0.50 - 1.35 mg/dL   Calcium 9.3  8.4 - 95.6 mg/dL   Total Protein 7.0  6.0 - 8.3 g/dL   Albumin 3.6  3.5 -  5.2 g/dL   AST 16  0 - 37 U/L   ALT <5  0 - 53 U/L   Alkaline Phosphatase 67  39 - 117 U/L   Total Bilirubin 0.3  0.3 - 1.2 mg/dL   GFR calc non Af Amer 48 (*) >90 mL/min   GFR calc Af Amer 55 (*) >90 mL/min  PROTIME-INR      Result Value Range   Prothrombin Time 14.0  11.6 - 15.2 seconds   INR 1.10  0.00 - 1.49  URINALYSIS, ROUTINE W REFLEX MICROSCOPIC      Result Value Range   Color, Urine YELLOW  YELLOW   APPearance CLEAR  CLEAR   Specific Gravity, Urine 1.026  1.005 - 1.030   pH 5.0  5.0 - 8.0   Glucose, UA NEGATIVE  NEGATIVE mg/dL   Hgb urine dipstick NEGATIVE  NEGATIVE   Bilirubin Urine NEGATIVE   NEGATIVE   Ketones, ur NEGATIVE  NEGATIVE mg/dL   Protein, ur NEGATIVE  NEGATIVE mg/dL   Urobilinogen, UA 1.0  0.0 - 1.0 mg/dL   Nitrite NEGATIVE  NEGATIVE   Leukocytes, UA NEGATIVE  NEGATIVE  CBC      Result Value Range   WBC 6.3  4.0 - 10.5 K/uL   RBC 3.23 (*) 4.22 - 5.81 MIL/uL   Hemoglobin 10.0 (*) 13.0 - 17.0 g/dL   HCT 82.9 (*) 56.2 - 13.0 %   MCV 91.3  78.0 - 100.0 fL   MCH 31.0  26.0 - 34.0 pg   MCHC 33.9  30.0 - 36.0 g/dL   RDW 86.5  78.4 - 69.6 %   Platelets 131 (*) 150 - 400 K/uL  BASIC METABOLIC PANEL      Result Value Range   Sodium 143  135 - 145 mEq/L   Potassium 4.0  3.5 - 5.1 mEq/L   Chloride 106  96 - 112 mEq/L   CO2 29  19 - 32 mEq/L   Glucose, Bld 121 (*) 70 - 99 mg/dL   BUN 19  6 - 23 mg/dL   Creatinine, Ser 2.95  0.50 - 1.35 mg/dL   Calcium 9.3  8.4 - 28.4 mg/dL   GFR calc non Af Amer 49 (*) >90 mL/min   GFR calc Af Amer 57 (*) >90 mL/min  VITAMIN D 25 HYDROXY      Result Value Range   Vit D, 25-Hydroxy 18 (*) 30 - 89 ng/mL  CBC WITH DIFFERENTIAL      Result Value Range   WBC 7.1  4.0 - 10.5 K/uL   RBC 2.82 (*) 4.22 - 5.81 MIL/uL   Hemoglobin 8.7 (*) 13.0 - 17.0 g/dL   HCT 13.2 (*) 44.0 - 10.2 %   MCV 89.7  78.0 - 100.0 fL   MCH 30.9  26.0 - 34.0 pg   MCHC 34.4  30.0 - 36.0 g/dL   RDW 72.5  36.6 - 44.0 %   Platelets 124 (*) 150 - 400 K/uL   Neutrophils Relative % 87 (*) 43 - 77 %   Neutro Abs 6.2  1.7 - 7.7 K/uL   Lymphocytes Relative 4 (*) 12 - 46 %   Lymphs Abs 0.3 (*) 0.7 - 4.0 K/uL   Monocytes Relative 9  3 - 12 %   Monocytes Absolute 0.6  0.1 - 1.0 K/uL   Eosinophils Relative 0  0 - 5 %   Eosinophils Absolute 0.0  0.0 - 0.7 K/uL   Basophils Relative 0  0 -  1 %   Basophils Absolute 0.0  0.0 - 0.1 K/uL  BASIC METABOLIC PANEL      Result Value Range   Sodium 139  135 - 145 mEq/L   Potassium 3.5  3.5 - 5.1 mEq/L   Chloride 103  96 - 112 mEq/L   CO2 23  19 - 32 mEq/L   Glucose, Bld 115 (*) 70 - 99 mg/dL   BUN 20  6 - 23 mg/dL    Creatinine, Ser 9.60  0.50 - 1.35 mg/dL   Calcium 8.9  8.4 - 45.4 mg/dL   GFR calc non Af Amer 49 (*) >90 mL/min   GFR calc Af Amer 56 (*) >90 mL/min  TSH      Result Value Range   TSH 0.560  0.350 - 4.500 uIU/mL  VITAMIN B12      Result Value Range   Vitamin B-12 219  211 - 911 pg/mL  CBC WITH DIFFERENTIAL      Result Value Range   WBC 7.5  4.0 - 10.5 K/uL   RBC 2.70 (*) 4.22 - 5.81 MIL/uL   Hemoglobin 8.3 (*) 13.0 - 17.0 g/dL   HCT 09.8 (*) 11.9 - 14.7 %   MCV 87.8  78.0 - 100.0 fL   MCH 30.7  26.0 - 34.0 pg   MCHC 35.0  30.0 - 36.0 g/dL   RDW 82.9  56.2 - 13.0 %   Platelets 115 (*) 150 - 400 K/uL   Neutrophils Relative % 79 (*) 43 - 77 %   Neutro Abs 5.9  1.7 - 7.7 K/uL   Lymphocytes Relative 10 (*) 12 - 46 %   Lymphs Abs 0.8  0.7 - 4.0 K/uL   Monocytes Relative 11  3 - 12 %   Monocytes Absolute 0.8  0.1 - 1.0 K/uL   Eosinophils Relative 0  0 - 5 %   Eosinophils Absolute 0.0  0.0 - 0.7 K/uL   Basophils Relative 0  0 - 1 %   Basophils Absolute 0.0  0.0 - 0.1 K/uL  BASIC METABOLIC PANEL      Result Value Range   Sodium 141  135 - 145 mEq/L   Potassium 3.5  3.5 - 5.1 mEq/L   Chloride 105  96 - 112 mEq/L   CO2 23  19 - 32 mEq/L   Glucose, Bld 118 (*) 70 - 99 mg/dL   BUN 23  6 - 23 mg/dL   Creatinine, Ser 8.65  0.50 - 1.35 mg/dL   Calcium 8.6  8.4 - 78.4 mg/dL   GFR calc non Af Amer 52 (*) >90 mL/min   GFR calc Af Amer 60 (*) >90 mL/min  T4, FREE      Result Value Range   Free T4 1.23  0.80 - 1.80 ng/dL  CBC WITH DIFFERENTIAL      Result Value Range   WBC 4.0  4.0 - 10.5 K/uL   RBC 2.30 (*) 4.22 - 5.81 MIL/uL   Hemoglobin 7.1 (*) 13.0 - 17.0 g/dL   HCT 69.6 (*) 29.5 - 28.4 %   MCV 90.4  78.0 - 100.0 fL   MCH 30.9  26.0 - 34.0 pg   MCHC 34.1  30.0 - 36.0 g/dL   RDW 13.2  44.0 - 10.2 %   Platelets 138 (*) 150 - 400 K/uL   Neutrophils Relative % 64  43 - 77 %   Neutro Abs 2.6  1.7 - 7.7 K/uL   Lymphocytes Relative  20  12 - 46 %   Lymphs Abs 0.8  0.7 - 4.0 K/uL    Monocytes Relative 10  3 - 12 %   Monocytes Absolute 0.4  0.1 - 1.0 K/uL   Eosinophils Relative 5  0 - 5 %   Eosinophils Absolute 0.2  0.0 - 0.7 K/uL   Basophils Relative 0  0 - 1 %   Basophils Absolute 0.0  0.0 - 0.1 K/uL  CBC      Result Value Range   WBC 4.4  4.0 - 10.5 K/uL   RBC 2.51 (*) 4.22 - 5.81 MIL/uL   Hemoglobin 7.8 (*) 13.0 - 17.0 g/dL   HCT 16.1 (*) 09.6 - 04.5 %   MCV 90.0  78.0 - 100.0 fL   MCH 31.1  26.0 - 34.0 pg   MCHC 34.5  30.0 - 36.0 g/dL   RDW 40.9  81.1 - 91.4 %   Platelets 163  150 - 400 K/uL  TYPE AND SCREEN      Result Value Range   ABO/RH(D) O POS     Antibody Screen NEG     Sample Expiration 03/10/2013    ABO/RH      Result Value Range   ABO/RH(D) O POS      Time coordinating discharge: 40 minutes  Signed: Pearla Dubonnet, MD 03/13/2013, 9:23 AM

## 2013-03-13 NOTE — Progress Notes (Signed)
Physical Therapy Treatment Patient Details Name: Kevin Hendrix MRN: [id number] DOB: 1929/02/02 Today's Date: 03/13/2013 Time: 7829-5621 PT Time Calculation (min): 21 min  PT Assessment / Plan / Recommendation  History of Present Illness Pt continues steady progress   PT Comments   Pt with several BMs today so is fatigued.  He was able to ambulate a short distance and will benefit from continued PT at SNF  Follow Up Recommendations  SNF     Does the patient have the potential to tolerate intense rehabilitation     Barriers to Discharge        Equipment Recommendations       Recommendations for Other Services    Frequency Min 4X/week   Progress towards PT Goals Progress towards PT goals: Progressing toward goals  Plan Current plan remains appropriate    Precautions / Restrictions Precautions Precautions: Fall Restrictions Weight Bearing Restrictions: No RLE Weight Bearing: Weight bearing as tolerated   Pertinent Vitals/Pain No c/o pain   Mobility  Bed Mobility Bed Mobility: Sit to Supine Supine to Sit: 3: Mod assist;HOB elevated;With rails Sitting - Scoot to Edge of Bed: With rail Sit to Supine: 3: Mod assist Details for Bed Mobility Assistance: cues and assist for technique, use of rail and pad Pt needed to have assist to bring legs up onto bed Transfers Transfers: Sit to Stand;Stand to Sit Sit to Stand: 4: Min assist;With upper extremity assist;From bed;From chair/3-in-1 Stand to Sit: 4: Min assist;With upper extremity assist;To bed;To chair/3-in-1 Details for Transfer Assistance: repeated sit to stand multiple times.  Pt maintains flexed trunk posture, but is able to rise to standing well Ambulation/Gait Ambulation/Gait Assistance: 3: Mod assist Ambulation Distance (Feet): 5 Feet Ambulation/Gait Assistance Details: pt fatiuged after standing for prolonged time to be cleaned from a BM and then ultimately getting to bedside commode.  Pt able to march in place 10  steps Gait Pattern: Step-to pattern;Decreased step length - left;Decreased step length - right;Decreased stance time - right;Trunk flexed Gait velocity: very slow General Gait Details: Pt limited by movement impairments from Parkinsons Stairs: No Wheelchair Mobility Wheelchair Mobility: No    Exercises General Exercises - Lower Extremity Ankle Circles/Pumps: AROM;Both;10 reps;Seated Hip Flexion/Marching: AAROM;Left;5 reps;Supine   PT Diagnosis:    PT Problem List:   PT Treatment Interventions:     PT Goals (current goals can now be found in the care plan section)    Visit Information  Last PT Received On: 03/13/13 Assistance Needed: +1 History of Present Illness: Pt continues steady progress    Subjective Data      Cognition  Cognition Arousal/Alertness: Awake/alert Behavior During Therapy: Flat affect Overall Cognitive Status: Within Functional Limits for tasks assessed General Comments: slow processing    Balance  Balance Balance Assessed: Yes Static Sitting Balance Static Sitting - Balance Support: Bilateral upper extremity supported;No upper extremity supported;Feet supported Static Sitting - Level of Assistance: 5: Stand by assistance (Pt with posterior lean initially.) Static Standing Balance Static Standing - Balance Support: Bilateral upper extremity supported Static Standing - Level of Assistance: 4: Min assist (> 5 minutes, trunk flexed, drooling)  End of Session PT - End of Session Activity Tolerance: Patient limited by fatigue Patient left: in chair;with family/visitor present;with call bell/phone within reach   GP    Teresa K. [place], [place] [phone] 03/13/2013, 3:05 PM

## 2013-03-13 NOTE — Clinical Social Work Note (Signed)
CSW has made several attempts to contact representative with patient's insurance company. Steward Drone, the usual representative is out of the office, and Gwenevere Abbot, hasn't returned any of CSW's phone calls. CSW will continue to reach out to [employer]. Patient and patient's family informed of [facility] bed offer. Patient will be expected to cover 20% of SNF stay. Roddie Mc, [place], [place], [phone]

## 2013-03-14 NOTE — Progress Notes (Signed)
NUTRITION FOLLOW UP  Intervention:    No nutrition intervention warranted at this time  RD to continue to follow  Nutrition Dx:   Increased nutrient needs related to hip fracture, post-op healing as evidenced by estimated nutrition needs, ongoing  Goal:   Pt to meet >/= 90% of their estimated nutrition needs, met  Monitor:   PO intake, weight, labs, I/O's  Assessment:   Patient with PMH of HTN and Parkinson's disease had a fall at his house; ER X-rays revealed right hip fracture.   Patient s/p procedure 8/28:  INTRAMEDULLARY NAIL INTERTROCHANTRIC (Right)  Patient confused upon RD visit; he ate well at breakfast per lunch tray observation (in patient's room); average % meal intake is 80% ---> RD feels patient is meeting estimated nutrition needs.  Height: Ht Readings from Last 1 Encounters:  03/07/13 5\' 6"  (1.676 m)    Weight Status:   Wt Readings from Last 1 Encounters:  03/08/13 141 lb 8.6 oz (64.2 kg)    Body mass index is 22.86 kg/(m^2).  Re-estimated needs:  Kcal: 1700-1900 Protein: 80-90 gm Fluid: 1.7-1.9 L  Skin: hip surgical incision   Diet Order: General   Intake/Output Summary (Last 24 hours) at 03/14/13 1211 Last data filed at 03/14/13 0800  Gross per 24 hour  Intake      0 ml  Output    500 ml  Net   -500 ml    Labs:   Recent Labs Lab 03/08/13 0500 03/09/13 0548 03/10/13 0400  NA 143 139 141  K 4.0 3.5 3.5  CL 106 103 105  CO2 29 23 23   BUN 19 20 23   CREATININE 1.30 1.32 1.25  CALCIUM 9.3 8.9 8.6  GLUCOSE 121* 115* 118*    CBG (last 3)   Recent Labs  03/13/13 1654  GLUCAP 136*    Scheduled Meds: . amLODipine  5 mg Oral Daily  . aspirin EC  325 mg Oral Q breakfast  . carbidopa-levodopa  1 tablet Oral BID   And  . carbidopa-levodopa  0.5 tablet Oral BID  . docusate sodium  100 mg Oral BID  . finasteride  5 mg Oral Daily  . irbesartan  150 mg Oral Daily  . polyethylene glycol  17 g Oral Daily  . senna-docusate  1  tablet Oral BID    Continuous Infusions:   Maureen Chatters, RD, LDN Pager #: [phone] After-Hours Pager #: [phone]

## 2013-03-14 NOTE — Progress Notes (Signed)
Report given to Harriett Sine, Charity fundraiser at [facility]). Transport is scheduled for 1400.

## 2013-03-14 NOTE — Progress Notes (Signed)
Subjective: Patient is feeling better.  Can discontinue IV fluids.  Also discontinue Foley catheter.  Would like to try to get patient up to [facility] skilled nursing facility but patient's family may not be able to afford a 20% co-pay.  If cannot go home with home physical therapy, will need to go to a facility the case 100% most likely.  Objective: Weight change:   Intake/Output Summary (Last 24 hours) at 03/14/13 0746 Last data filed at 03/14/13 0500  Gross per 24 hour  Intake      0 ml  Output    700 ml  Net   -700 ml   Filed Vitals:   03/13/13 1200 03/13/13 1300 03/13/13 2050 03/14/13 0540  BP:  143/64 162/74 151/73  Pulse:  72 70 66  Temp:  98.4 F (36.9 C) 98.6 F (37 C) 98.1 F (36.7 C)  TempSrc:  Oral Oral Oral  Resp: 20 18 20 18   Height:      Weight:      SpO2: 95% 96% 99% 99%   General Appearance: Alert, cooperative, no distress, appears stated age  Lungs: Clear to auscultation bilaterally, respirations unlabored  Heart: Regular rate and rhythm, S1 and S2 normal, no murmur, rub or gallop  Abdomen: Soft, non-tender, bowel sounds active all four quadrants, no masses, no organomegaly  Extremities: status post right hip fracture repair  Neuro: Alert, Parkinsonian features, otherwise nonfocal  Lab Results: Results for orders placed during the hospital encounter of 03/07/13 (from the past 48 hour(s))  CBC     Status: Abnormal   Collection Time    03/13/13  6:00 AM      Result Value Range   WBC 4.4  4.0 - 10.5 K/uL   RBC 2.51 (*) 4.22 - 5.81 MIL/uL   Hemoglobin 7.8 (*) 13.0 - 17.0 g/dL   HCT 16.1 (*) 09.6 - 04.5 %   MCV 90.0  78.0 - 100.0 fL   MCH 31.1  26.0 - 34.0 pg   MCHC 34.5  30.0 - 36.0 g/dL   RDW 40.9  81.1 - 91.4 %   Platelets 163  150 - 400 K/uL  GLUCOSE, CAPILLARY     Status: Abnormal   Collection Time    03/13/13  4:54 PM      Result Value Range   Glucose-Capillary 136 (*) 70 - 99 mg/dL    Studies/Results: No results found. Medications: Scheduled  Meds: . amLODipine  5 mg Oral Daily  . aspirin EC  325 mg Oral Q breakfast  . carbidopa-levodopa  1 tablet Oral BID   And  . carbidopa-levodopa  0.5 tablet Oral BID  . docusate sodium  100 mg Oral BID  . finasteride  5 mg Oral Daily  . irbesartan  150 mg Oral Daily  . polyethylene glycol  17 g Oral Daily  . senna-docusate  1 tablet Oral BID   Continuous Infusions: . lactated ringers 50 mL/hr at 03/08/13 1035   PRN Meds:.acetaminophen, acetaminophen, HYDROcodone-acetaminophen, HYDROcodone-acetaminophen, LORazepam, LORazepam, menthol-cetylpyridinium, methocarbamol (ROBAXIN) IV, methocarbamol, metoCLOPramide (REGLAN) injection, metoCLOPramide, morphine injection, morphine injection, ondansetron (ZOFRAN) IV, ondansetron, phenol  Assessment/Plan:  Principal Problem:  Hip fracture - ambulating already, ready for SNF placement for rehabilitation - if cannot go to [facility] skilled nursing facility secondary to pain issues, we'll need to go to a skilled nursing facility that does not provide any financial burden on the family.  Active Problems:  Parkinson's disease - stable  HTN (hypertension) - stable  Anemia - hemoglobin  up to 7.8 from 7.1. Will treat with iron therapy at skilled nursing facility   LOS: 7 days   Pearla Dubonnet, MD 03/14/2013, 7:46 AM

## 2013-03-15 NOTE — Clinical Social Work Placement (Signed)
Clinical Social Work Department CLINICAL SOCIAL WORK PLACEMENT NOTE 03/15/2013  Patient:  BAKER, MORONTA  Account Number:  [id number] Admit date:  03/07/2013  Clinical Social Worker:  Cherre Blanc, [place]  Date/time:  03/09/2013 04:00 PM  Clinical Social Work is seeking post-discharge placement for this patient at the following level of care:   SKILLED NURSING   (*CSW will update this form in Epic as items are completed)   03/09/2013  Patient/family provided with [facility] Department of Clinical Social Work's list of facilities offering this level of care within the geographic area requested by the patient (or if unable, by the patient's family).  03/09/2013  Patient/family informed of their freedom to choose among providers that offer the needed level of care, that participate in Medicare, Medicaid or managed care program needed by the patient, have an available bed and are willing to accept the patient.  03/09/2013  Patient/family informed of [facility]' ownership interest in [facility], as well as of the fact that they are under no obligation to receive care at this facility.  PASARR submitted to [employer]DS on 03/09/2013 PASARR number received from [employer] on 03/09/2013  FL2 transmitted to all facilities in geographic area requested by pt/family on  03/09/2013 FL2 transmitted to all facilities within larger geographic area on   Patient informed that his/her managed care company has contracts with or will negotiate with  certain facilities, including the following:     Patient/family informed of bed offers received:  03/13/2013 Patient chooses bed at [facility], [place] Physician recommends and patient chooses bed at    [facility] [place] on  03/14/2013 Patient to be transferred to facility by Ambulance  The following physician request were entered in Epic:   Additional Comments: Per MD  patient ready for DC 03/14/13. Patient will be transported to [facility] via ambulance. Family has completed paperwork at facility. Patient, family, and facility notified of DC. CSW signing off.   Roddie Mc, [place], [place], [phone]

## 2013-07-16 ENCOUNTER — Telehealth: Payer: Self-pay | Admitting: *Deleted

## 2013-07-16 MED ORDER — CARBIDOPA-LEVODOPA 25-250 MG PO TABS: ORAL_TABLET | ORAL | Status: DC

## 2013-07-16 NOTE — Telephone Encounter (Signed)
Last OV Says: Sinemet 25/250 mg strength one tablet at 8 AM, 1/2 at 11, 1 at 4 PM and 1/2 at 7 PM

## 2013-08-16 ENCOUNTER — Other Ambulatory Visit: Payer: Self-pay | Admitting: Neurology

## 2013-08-27 ENCOUNTER — Other Ambulatory Visit: Payer: Self-pay | Admitting: Neurology

## 2013-09-09 ENCOUNTER — Other Ambulatory Visit: Payer: Self-pay | Admitting: Neurology

## 2013-09-10 ENCOUNTER — Telehealth: Payer: Self-pay | Admitting: Neurology

## 2013-09-10 NOTE — Telephone Encounter (Signed)
Rx has already been sent.  Patient will need to schedule follow up appt.  Last seen in August.  Dr Rexene Alberts asked that he return in about 4 months for follow up.

## 2013-09-10 NOTE — Telephone Encounter (Signed)
Patient's daughter calling for her father requesting refills for his carbidopa (generic brand due to cost) [facility] on [address].

## 2013-09-23 ENCOUNTER — Other Ambulatory Visit: Payer: Self-pay | Admitting: Neurology

## 2013-09-25 ENCOUNTER — Other Ambulatory Visit: Payer: Self-pay | Admitting: Neurology

## 2013-09-26 NOTE — Telephone Encounter (Signed)
I have already spoken to the patient who says they will schedule a follow up visit

## 2013-09-27 ENCOUNTER — Telehealth: Payer: Self-pay | Admitting: Neurology

## 2013-09-27 MED ORDER — CARBIDOPA-LEVODOPA 25-250 MG PO TABS: ORAL_TABLET | ORAL | Status: DC

## 2013-09-27 NOTE — Telephone Encounter (Signed)
Pt called in requesting the rest of his medication carbidopa-levodopa (SINEMET IR) 25-250 MG per tablet. Pt states he only recd so many because he needed to get an apt with Dr. Rexene Alberts. I scheduled pt in Aug next available and put pt on waiting list. Pt would like a c/b letting concerning his medication. Thanks

## 2013-09-27 NOTE — Telephone Encounter (Signed)
Rx has been sent.  I called the patient back.  They are aware.

## 2013-10-18 ENCOUNTER — Ambulatory Visit: Payer: Medicare Other | Attending: Internal Medicine | Admitting: Rehabilitative and Restorative Service Providers"

## 2013-10-18 DIAGNOSIS — G20A1 Parkinson's disease without dyskinesia, without mention of fluctuations: Secondary | ICD-10-CM | POA: Insufficient documentation

## 2013-10-18 DIAGNOSIS — Z5189 Encounter for other specified aftercare: Secondary | ICD-10-CM | POA: Insufficient documentation

## 2013-10-18 DIAGNOSIS — Z9181 History of falling: Secondary | ICD-10-CM | POA: Insufficient documentation

## 2013-10-18 DIAGNOSIS — G20 Parkinson's disease: Secondary | ICD-10-CM | POA: Insufficient documentation

## 2013-10-18 DIAGNOSIS — R269 Unspecified abnormalities of gait and mobility: Secondary | ICD-10-CM | POA: Insufficient documentation

## 2014-01-14 ENCOUNTER — Telehealth: Payer: Self-pay | Admitting: Neurology

## 2014-01-14 NOTE — Telephone Encounter (Signed)
Patient's wife called to to state that the patient took his first dose of  carbidopa/levodopa at bedtime last night he seemed out of it. He took another dose this morning and she states that  he does not seem to be able to get himself together. Wife is concerned. Please call to advise.

## 2014-01-15 NOTE — Telephone Encounter (Signed)
Called pt and spoke with pt's wife Macie Burows to make an appt for the pt to come in per Dr. Rexene Alberts on 01/17/14. I advised the wife that if the pt has any other problems, questions or concerns to call the office. Wife verbalized understanding. FYI

## 2014-01-15 NOTE — Telephone Encounter (Signed)
I cannot really tell what is going on. I have not seen pt in almost a year. Pls have him make appt
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
## 2014-01-17 ENCOUNTER — Ambulatory Visit (INDEPENDENT_AMBULATORY_CARE_PROVIDER_SITE_OTHER): Payer: Medicare Other | Admitting: Neurology

## 2014-01-17 ENCOUNTER — Encounter: Payer: Self-pay | Admitting: Neurology

## 2014-01-17 VITALS — BP 129/67 | HR 75 | Temp 99.5°F | Ht 66.0 in

## 2014-01-17 DIAGNOSIS — Z9181 History of falling: Secondary | ICD-10-CM

## 2014-01-17 DIAGNOSIS — R269 Unspecified abnormalities of gait and mobility: Secondary | ICD-10-CM

## 2014-01-17 DIAGNOSIS — R413 Other amnesia: Secondary | ICD-10-CM

## 2014-01-17 DIAGNOSIS — D329 Benign neoplasm of meninges, unspecified: Secondary | ICD-10-CM

## 2014-01-17 DIAGNOSIS — G20A1 Parkinson's disease without dyskinesia, without mention of fluctuations: Secondary | ICD-10-CM

## 2014-01-17 DIAGNOSIS — G20 Parkinson's disease: Secondary | ICD-10-CM

## 2014-01-17 DIAGNOSIS — S72009S Fracture of unspecified part of neck of unspecified femur, sequela: Secondary | ICD-10-CM

## 2014-01-17 DIAGNOSIS — D320 Benign neoplasm of cerebral meninges: Secondary | ICD-10-CM

## 2014-01-17 DIAGNOSIS — S72141S Displaced intertrochanteric fracture of right femur, sequela: Secondary | ICD-10-CM

## 2014-01-17 DIAGNOSIS — R296 Repeated falls: Secondary | ICD-10-CM

## 2014-01-17 MED ORDER — CARBIDOPA-LEVODOPA 25-250 MG PO TABS: ORAL_TABLET | ORAL | Status: DC

## 2014-01-17 NOTE — Patient Instructions (Signed)
Please restart your Sinemet with half a pill 3 times a day, at 7 AM, 11 AM and 3 PM for one week, then increase to half a pill 4 times a day, at 7 AM, 11 AM, 3 PM, and 7 PM thereafter.
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
## 2014-02-15 ENCOUNTER — Ambulatory Visit: Payer: PRIVATE HEALTH INSURANCE | Admitting: Neurology

## 2014-04-01 ENCOUNTER — Other Ambulatory Visit: Payer: Self-pay | Admitting: Neurology

## 2014-04-02 ENCOUNTER — Telehealth: Payer: Self-pay | Admitting: Nurse Practitioner

## 2014-04-02 NOTE — Telephone Encounter (Signed)
Called patient wife to see what was going on with the patient. She stated the patient is having shortness of breath and I advised them from [facility] to go to urgent care. The wife stated it does not happen often just sometimes and when it happens she will take him to urgent care.

## 2014-04-02 NOTE — Telephone Encounter (Signed)
Patient's wife calling to get a sooner appointment for patient due to shortness of breath, please return call and advise.

## 2014-04-19 ENCOUNTER — Other Ambulatory Visit: Payer: Self-pay | Admitting: Neurology

## 2014-04-19 ENCOUNTER — Ambulatory Visit: Payer: Medicare Other | Admitting: Nurse Practitioner

## 2014-07-22 ENCOUNTER — Ambulatory Visit (INDEPENDENT_AMBULATORY_CARE_PROVIDER_SITE_OTHER): Payer: Medicare Other | Admitting: Neurology

## 2014-07-22 ENCOUNTER — Encounter: Payer: Self-pay | Admitting: Neurology

## 2014-07-22 VITALS — BP 190/76 | HR 68 | Temp 98.0°F

## 2014-07-22 DIAGNOSIS — K117 Disturbances of salivary secretion: Secondary | ICD-10-CM | POA: Diagnosis not present

## 2014-07-22 DIAGNOSIS — R296 Repeated falls: Secondary | ICD-10-CM

## 2014-07-22 DIAGNOSIS — D329 Benign neoplasm of meninges, unspecified: Secondary | ICD-10-CM

## 2014-07-22 DIAGNOSIS — G20 Parkinson's disease: Secondary | ICD-10-CM | POA: Diagnosis not present

## 2014-07-22 DIAGNOSIS — R413 Other amnesia: Secondary | ICD-10-CM

## 2014-07-22 DIAGNOSIS — S72141S Displaced intertrochanteric fracture of right femur, sequela: Secondary | ICD-10-CM

## 2014-07-22 MED ORDER — CARBIDOPA-LEVODOPA 25-250 MG PO TABS: ORAL_TABLET | ORAL | Status: DC

## 2014-07-22 NOTE — Patient Instructions (Addendum)
We will try to increase your sinemet to 1 pill at 3 PM and keep the other 3 doses at 1/2 pills.  We will do an MRI brain to monitor your brain meningioma.
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
## 2014-08-12 ENCOUNTER — Ambulatory Visit
Admission: RE | Admit: 2014-08-12 | Discharge: 2014-08-12 | Disposition: A | Discharge status: Home / Self Care | Payer: Medicare Other | Source: Ambulatory Visit | Attending: Neurology | Admitting: Neurology

## 2014-08-12 DIAGNOSIS — D329 Benign neoplasm of meninges, unspecified: Secondary | ICD-10-CM

## 2014-08-12 DIAGNOSIS — G20 Parkinson's disease: Secondary | ICD-10-CM | POA: Diagnosis not present

## 2014-08-12 MED ORDER — GADOBENATE DIMEGLUMINE 529 MG/ML IV SOLN: 14.0000 mL | Freq: Once | INTRAVENOUS | Status: AC | PRN

## 2014-08-13 NOTE — Progress Notes (Signed)
Quick Note:  Please call patient's wife or daughter with his recent brain MRI results. Thankfully findings are stable and when compared to his brain MRI from September 2013 there are no significant changes which is reassuring. We will probably repeat his brain MRI on a yearly basis, if possible.  Star Age, MD, PhD [facility] ([facility]  ______

## 2014-09-05 ENCOUNTER — Other Ambulatory Visit: Payer: Self-pay | Admitting: Neurology

## 2014-10-04 ENCOUNTER — Other Ambulatory Visit: Payer: Self-pay

## 2014-10-04 MED ORDER — CARBIDOPA-LEVODOPA 25-250 MG PO TABS: ORAL_TABLET | ORAL | Status: DC

## 2015-01-29 ENCOUNTER — Encounter: Payer: Self-pay | Admitting: Neurology

## 2015-01-29 ENCOUNTER — Ambulatory Visit (INDEPENDENT_AMBULATORY_CARE_PROVIDER_SITE_OTHER): Payer: Medicare Other | Admitting: Neurology

## 2015-01-29 VITALS — BP 132/64 | HR 68 | Resp 14

## 2015-01-29 DIAGNOSIS — R269 Unspecified abnormalities of gait and mobility: Secondary | ICD-10-CM

## 2015-01-29 DIAGNOSIS — R54 Age-related physical debility: Secondary | ICD-10-CM

## 2015-01-29 DIAGNOSIS — R296 Repeated falls: Secondary | ICD-10-CM | POA: Diagnosis not present

## 2015-01-29 DIAGNOSIS — D329 Benign neoplasm of meninges, unspecified: Secondary | ICD-10-CM

## 2015-01-29 DIAGNOSIS — G20 Parkinson's disease: Secondary | ICD-10-CM

## 2015-01-29 DIAGNOSIS — G20A1 Parkinson's disease without dyskinesia, without mention of fluctuations: Secondary | ICD-10-CM

## 2015-01-29 MED ORDER — CARBIDOPA-LEVODOPA 25-100 MG PO TABS
1.0000 | ORAL_TABLET | Freq: Four times a day (QID) | ORAL | Status: DC
Start: 2015-01-29 — End: 2015-09-10

## 2015-01-29 NOTE — Patient Instructions (Signed)
For your next prescription refill on Sinemet, we will change your medicine to sinemet 25/100 mg: take 1 whole pill 4 times a day, at 7 AM, 11 AM, 3 PM and 7 PM.
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
## 2015-03-03 ENCOUNTER — Emergency Department (HOSPITAL_COMMUNITY): Payer: Medicare Other

## 2015-03-03 ENCOUNTER — Encounter (HOSPITAL_COMMUNITY): Payer: Self-pay

## 2015-03-03 ENCOUNTER — Emergency Department (HOSPITAL_COMMUNITY)
Admission: EM | Admit: 2015-03-03 | Discharge: 2015-03-03 | Disposition: A | Discharge status: Home / Self Care | Payer: Medicare Other | Attending: Emergency Medicine | Admitting: Emergency Medicine

## 2015-03-03 DIAGNOSIS — I10 Essential (primary) hypertension: Secondary | ICD-10-CM | POA: Diagnosis not present

## 2015-03-03 DIAGNOSIS — T148XXA Other injury of unspecified body region, initial encounter: Secondary | ICD-10-CM

## 2015-03-03 DIAGNOSIS — Z79899 Other long term (current) drug therapy: Secondary | ICD-10-CM | POA: Diagnosis not present

## 2015-03-03 DIAGNOSIS — Z87891 Personal history of nicotine dependence: Secondary | ICD-10-CM | POA: Insufficient documentation

## 2015-03-03 DIAGNOSIS — G20 Parkinson's disease: Secondary | ICD-10-CM | POA: Diagnosis not present

## 2015-03-03 DIAGNOSIS — L089 Local infection of the skin and subcutaneous tissue, unspecified: Secondary | ICD-10-CM | POA: Diagnosis not present

## 2015-03-03 DIAGNOSIS — R2243 Localized swelling, mass and lump, lower limb, bilateral: Secondary | ICD-10-CM | POA: Insufficient documentation

## 2015-03-03 DIAGNOSIS — Z23 Encounter for immunization: Secondary | ICD-10-CM | POA: Insufficient documentation

## 2015-03-03 DIAGNOSIS — Z7982 Long term (current) use of aspirin: Secondary | ICD-10-CM | POA: Diagnosis not present

## 2015-03-03 LAB — CBC WITH DIFFERENTIAL/PLATELET
BASOS PCT: 0 % (ref 0–1)
Basophils Absolute: 0 10*3/uL (ref 0.0–0.1)
EOS ABS: 0.1 10*3/uL (ref 0.0–0.7)
EOS PCT: 2 % (ref 0–5)
HEMATOCRIT: 37.9 % — AB (ref 39.0–52.0)
Hemoglobin: 12.4 g/dL — ABNORMAL LOW (ref 13.0–17.0)
Lymphocytes Relative: 28 % (ref 12–46)
Lymphs Abs: 0.9 10*3/uL (ref 0.7–4.0)
MCH: 30.9 pg (ref 26.0–34.0)
MCHC: 32.7 g/dL (ref 30.0–36.0)
MCV: 94.5 fL (ref 78.0–100.0)
MONO ABS: 0.4 10*3/uL (ref 0.1–1.0)
MONOS PCT: 13 % — AB (ref 3–12)
NEUTROS ABS: 1.9 10*3/uL (ref 1.7–7.7)
Neutrophils Relative %: 57 % (ref 43–77)
Platelets: 172 10*3/uL (ref 150–400)
RBC: 4.01 MIL/uL — ABNORMAL LOW (ref 4.22–5.81)
RDW: 13.7 % (ref 11.5–15.5)
WBC: 3.3 10*3/uL — ABNORMAL LOW (ref 4.0–10.5)

## 2015-03-03 LAB — I-STAT CHEM 8, ED
BUN: 20 mg/dL (ref 6–20)
CALCIUM ION: 1.13 mmol/L (ref 1.13–1.30)
CREATININE: 1.1 mg/dL (ref 0.61–1.24)
Chloride: 102 mmol/L (ref 101–111)
GLUCOSE: 95 mg/dL (ref 65–99)
HCT: 41 % (ref 39.0–52.0)
Hemoglobin: 13.9 g/dL (ref 13.0–17.0)
Potassium: 3.8 mmol/L (ref 3.5–5.1)
Sodium: 141 mmol/L (ref 135–145)
TCO2: 27 mmol/L (ref 0–100)

## 2015-03-03 MED ORDER — BACITRACIN ZINC 500 UNIT/GM EX OINT
TOPICAL_OINTMENT | Freq: Two times a day (BID) | CUTANEOUS | Status: DC
  Administered 2015-03-03: 1 via TOPICAL
  Filled 2015-03-03: qty 1.8

## 2015-03-03 MED ORDER — TETANUS-DIPHTH-ACELL PERTUSSIS 5-2.5-18.5 LF-MCG/0.5 IM SUSP
0.5000 mL | Freq: Once | INTRAMUSCULAR | Status: AC
  Administered 2015-03-03: 0.5 mL via INTRAMUSCULAR
  Filled 2015-03-03: qty 0.5
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
## 2015-03-03 NOTE — Discharge Instructions (Signed)
Wash the wound on Kevin Hendrix's right leg daily with soap and water and then place a thin layer of bacitracin ointment and nonstick bandage over the wound. His wound should be rechecked in 2 or 3 days at Dr. Geraldo Docker office. It is okay to stop Levaquin. Kevin Hendrix's blood pressure was high tonight at 186/86. It should be rechecked within a week at Dr. Geraldo Docker office. His medications may need to be adjusted. Return if his condition worsens for any reason.

## 2015-03-03 NOTE — ED Notes (Signed)
MD at bedside. 

## 2015-03-03 NOTE — ED Notes (Signed)
Unable to collect labs at this time patient is not in the room 

## 2015-03-03 NOTE — ED Notes (Signed)
Patient transported to X-ray 

## 2015-03-03 NOTE — ED Notes (Signed)
Pt has draining wounds on the right lower leg and his blood pressure is elevated, Dr Inda Merlin referred him to the ED

## 2015-03-03 NOTE — ED Notes (Signed)
Per family, pt on ATB post fall last week for wound on RLE.   BLE +2 pitting edema

## 2015-03-03 NOTE — ED Notes (Signed)
Pt and family verbalized understanding discharge instructions and follow-up. In no acute distress.

## 2015-07-30 ENCOUNTER — Telehealth: Payer: Self-pay

## 2015-07-30 NOTE — Telephone Encounter (Signed)
Dr. Rexene Alberts hs to leave early on 1/19. I called patient and spoke to wife. We were able to reschedule appt to 1/25

## 2015-07-31 ENCOUNTER — Ambulatory Visit: Payer: Medicare Other | Admitting: Neurology

## 2015-08-01 ENCOUNTER — Ambulatory Visit: Payer: Medicare Other | Admitting: Neurology

## 2015-08-06 ENCOUNTER — Ambulatory Visit: Payer: Self-pay | Admitting: Neurology

## 2015-08-06 ENCOUNTER — Telehealth: Payer: Self-pay

## 2015-08-06 NOTE — Telephone Encounter (Signed)
I spoke to wife and let her know that Dr. Rexene Alberts is out sick. Rescheduled appt to February.

## 2015-08-20 ENCOUNTER — Telehealth: Payer: Self-pay

## 2015-08-20 ENCOUNTER — Ambulatory Visit: Payer: Self-pay | Admitting: Neurology

## 2015-08-20 NOTE — Telephone Encounter (Signed)
Patient did not show to appt today  

## 2015-08-21 ENCOUNTER — Encounter: Payer: Self-pay | Admitting: Neurology

## 2015-09-10 ENCOUNTER — Ambulatory Visit (INDEPENDENT_AMBULATORY_CARE_PROVIDER_SITE_OTHER): Payer: Medicare Other | Admitting: Neurology

## 2015-09-10 ENCOUNTER — Encounter: Payer: Self-pay | Admitting: Neurology

## 2015-09-10 VITALS — BP 110/68 | HR 70 | Resp 14

## 2015-09-10 DIAGNOSIS — G20 Parkinson's disease: Secondary | ICD-10-CM

## 2015-09-10 MED ORDER — CARBIDOPA-LEVODOPA 25-100 MG PO TABS: 1.0000 | ORAL_TABLET | Freq: Four times a day (QID) | ORAL | Status: AC

## 2015-09-10 NOTE — Patient Instructions (Signed)
Your exam is stable, let's keep the Sinemet the same at 1 pill 4 times a day. Memory is stable.
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
## 2015-09-22 ENCOUNTER — Emergency Department (HOSPITAL_COMMUNITY): Payer: Medicare Other

## 2015-09-22 ENCOUNTER — Inpatient Hospital Stay (HOSPITAL_COMMUNITY): Payer: Medicare Other

## 2015-09-22 ENCOUNTER — Inpatient Hospital Stay (HOSPITAL_COMMUNITY)
Admission: EM | Admit: 2015-09-22 | Discharge: 2015-09-26 | DRG: 481 | Disposition: A | Discharge status: Skilled Nursing Facility | Payer: Medicare Other | Attending: Internal Medicine | Admitting: Internal Medicine

## 2015-09-22 ENCOUNTER — Encounter (HOSPITAL_COMMUNITY): Payer: Self-pay

## 2015-09-22 DIAGNOSIS — E440 Moderate protein-calorie malnutrition: Secondary | ICD-10-CM | POA: Diagnosis present

## 2015-09-22 DIAGNOSIS — N183 Chronic kidney disease, stage 3 unspecified: Secondary | ICD-10-CM | POA: Diagnosis present

## 2015-09-22 DIAGNOSIS — I481 Persistent atrial fibrillation: Secondary | ICD-10-CM | POA: Diagnosis not present

## 2015-09-22 DIAGNOSIS — I482 Chronic atrial fibrillation: Secondary | ICD-10-CM | POA: Diagnosis present

## 2015-09-22 DIAGNOSIS — Z6821 Body mass index (BMI) 21.0-21.9, adult: Secondary | ICD-10-CM

## 2015-09-22 DIAGNOSIS — S72142A Displaced intertrochanteric fracture of left femur, initial encounter for closed fracture: Principal | ICD-10-CM | POA: Diagnosis present

## 2015-09-22 DIAGNOSIS — Z9181 History of falling: Secondary | ICD-10-CM

## 2015-09-22 DIAGNOSIS — Y92009 Unspecified place in unspecified non-institutional (private) residence as the place of occurrence of the external cause: Secondary | ICD-10-CM | POA: Diagnosis not present

## 2015-09-22 DIAGNOSIS — I4891 Unspecified atrial fibrillation: Secondary | ICD-10-CM | POA: Diagnosis present

## 2015-09-22 DIAGNOSIS — D631 Anemia in chronic kidney disease: Secondary | ICD-10-CM | POA: Diagnosis present

## 2015-09-22 DIAGNOSIS — R000 Tachycardia, unspecified: Secondary | ICD-10-CM | POA: Diagnosis not present

## 2015-09-22 DIAGNOSIS — S72002C Fracture of unspecified part of neck of left femur, initial encounter for open fracture type IIIA, IIIB, or IIIC: Secondary | ICD-10-CM | POA: Diagnosis not present

## 2015-09-22 DIAGNOSIS — Z87891 Personal history of nicotine dependence: Secondary | ICD-10-CM

## 2015-09-22 DIAGNOSIS — W010XXA Fall on same level from slipping, tripping and stumbling without subsequent striking against object, initial encounter: Secondary | ICD-10-CM | POA: Diagnosis present

## 2015-09-22 DIAGNOSIS — R1310 Dysphagia, unspecified: Secondary | ICD-10-CM | POA: Diagnosis present

## 2015-09-22 DIAGNOSIS — D649 Anemia, unspecified: Secondary | ICD-10-CM | POA: Diagnosis present

## 2015-09-22 DIAGNOSIS — R54 Age-related physical debility: Secondary | ICD-10-CM | POA: Diagnosis present

## 2015-09-22 DIAGNOSIS — Z7982 Long term (current) use of aspirin: Secondary | ICD-10-CM

## 2015-09-22 DIAGNOSIS — G20 Parkinson's disease: Secondary | ICD-10-CM | POA: Diagnosis present

## 2015-09-22 DIAGNOSIS — I480 Paroxysmal atrial fibrillation: Secondary | ICD-10-CM | POA: Diagnosis not present

## 2015-09-22 DIAGNOSIS — N189 Chronic kidney disease, unspecified: Secondary | ICD-10-CM

## 2015-09-22 DIAGNOSIS — R2681 Unsteadiness on feet: Secondary | ICD-10-CM | POA: Diagnosis present

## 2015-09-22 DIAGNOSIS — K5900 Constipation, unspecified: Secondary | ICD-10-CM | POA: Diagnosis present

## 2015-09-22 DIAGNOSIS — D62 Acute posthemorrhagic anemia: Secondary | ICD-10-CM | POA: Diagnosis not present

## 2015-09-22 DIAGNOSIS — R509 Fever, unspecified: Secondary | ICD-10-CM | POA: Diagnosis not present

## 2015-09-22 DIAGNOSIS — R9389 Abnormal findings on diagnostic imaging of other specified body structures: Secondary | ICD-10-CM

## 2015-09-22 DIAGNOSIS — S72001A Fracture of unspecified part of neck of right femur, initial encounter for closed fracture: Secondary | ICD-10-CM | POA: Diagnosis not present

## 2015-09-22 DIAGNOSIS — I10 Essential (primary) hypertension: Secondary | ICD-10-CM | POA: Diagnosis present

## 2015-09-22 DIAGNOSIS — R269 Unspecified abnormalities of gait and mobility: Secondary | ICD-10-CM | POA: Diagnosis present

## 2015-09-22 DIAGNOSIS — S72002A Fracture of unspecified part of neck of left femur, initial encounter for closed fracture: Secondary | ICD-10-CM | POA: Diagnosis not present

## 2015-09-22 DIAGNOSIS — D500 Iron deficiency anemia secondary to blood loss (chronic): Secondary | ICD-10-CM | POA: Diagnosis not present

## 2015-09-22 DIAGNOSIS — D508 Other iron deficiency anemias: Secondary | ICD-10-CM | POA: Diagnosis not present

## 2015-09-22 DIAGNOSIS — I129 Hypertensive chronic kidney disease with stage 1 through stage 4 chronic kidney disease, or unspecified chronic kidney disease: Secondary | ICD-10-CM | POA: Diagnosis present

## 2015-09-22 DIAGNOSIS — R0602 Shortness of breath: Secondary | ICD-10-CM

## 2015-09-22 DIAGNOSIS — S72009A Fracture of unspecified part of neck of unspecified femur, initial encounter for closed fracture: Secondary | ICD-10-CM | POA: Diagnosis present

## 2015-09-22 DIAGNOSIS — W19XXXA Unspecified fall, initial encounter: Secondary | ICD-10-CM | POA: Insufficient documentation

## 2015-09-22 DIAGNOSIS — Z419 Encounter for procedure for purposes other than remedying health state, unspecified: Secondary | ICD-10-CM

## 2015-09-22 LAB — URINE MICROSCOPIC-ADD ON
BACTERIA UA: NONE SEEN
WBC, UA: NONE SEEN WBC/hpf (ref 0–5)

## 2015-09-22 LAB — CBC WITH DIFFERENTIAL/PLATELET
Basophils Absolute: 0 10*3/uL (ref 0.0–0.1)
Basophils Relative: 0 %
EOS PCT: 0 %
Eosinophils Absolute: 0 10*3/uL (ref 0.0–0.7)
HCT: 35.3 % — ABNORMAL LOW (ref 39.0–52.0)
Hemoglobin: 11.8 g/dL — ABNORMAL LOW (ref 13.0–17.0)
LYMPHS ABS: 0.6 10*3/uL — AB (ref 0.7–4.0)
LYMPHS PCT: 9 %
MCH: 30.4 pg (ref 26.0–34.0)
MCHC: 33.4 g/dL (ref 30.0–36.0)
MCV: 91 fL (ref 78.0–100.0)
MONO ABS: 0.5 10*3/uL (ref 0.1–1.0)
Monocytes Relative: 8 %
Neutro Abs: 5.7 10*3/uL (ref 1.7–7.7)
Neutrophils Relative %: 83 %
PLATELETS: 146 10*3/uL — AB (ref 150–400)
RBC: 3.88 MIL/uL — AB (ref 4.22–5.81)
RDW: 13.5 % (ref 11.5–15.5)
WBC: 6.8 10*3/uL (ref 4.0–10.5)

## 2015-09-22 LAB — COMPREHENSIVE METABOLIC PANEL
ALBUMIN: 3.9 g/dL (ref 3.5–5.0)
ALT: 7 U/L — ABNORMAL LOW (ref 17–63)
AST: 21 U/L (ref 15–41)
Alkaline Phosphatase: 54 U/L (ref 38–126)
Anion gap: 10 (ref 5–15)
BUN: 23 mg/dL — AB (ref 6–20)
CHLORIDE: 101 mmol/L (ref 101–111)
CO2: 28 mmol/L (ref 22–32)
Calcium: 9 mg/dL (ref 8.9–10.3)
Creatinine, Ser: 1.42 mg/dL — ABNORMAL HIGH (ref 0.61–1.24)
GFR calc Af Amer: 51 mL/min — ABNORMAL LOW (ref >60)
GFR calc non Af Amer: 44 mL/min — ABNORMAL LOW (ref >60)
GLUCOSE: 110 mg/dL — AB (ref 65–99)
POTASSIUM: 3.5 mmol/L (ref 3.5–5.1)
Sodium: 139 mmol/L (ref 135–145)
Total Bilirubin: 0.7 mg/dL (ref 0.3–1.2)
Total Protein: 7.4 g/dL (ref 6.5–8.1)

## 2015-09-22 LAB — APTT: aPTT: 30 seconds (ref 24–37)

## 2015-09-22 LAB — URINALYSIS, ROUTINE W REFLEX MICROSCOPIC
Bilirubin Urine: NEGATIVE
Glucose, UA: NEGATIVE mg/dL
KETONES UR: NEGATIVE mg/dL
LEUKOCYTES UA: NEGATIVE
NITRITE: NEGATIVE
PH: 7 (ref 5.0–8.0)
PROTEIN: NEGATIVE mg/dL
Specific Gravity, Urine: 1.01 (ref 1.005–1.030)

## 2015-09-22 LAB — PROTIME-INR
INR: 1.24 (ref 0.00–1.49)
PROTHROMBIN TIME: 15.2 s (ref 11.6–15.2)

## 2015-09-22 LAB — TYPE AND SCREEN
ABO/RH(D): O POS
ANTIBODY SCREEN: NEGATIVE

## 2015-09-22 MED ORDER — BISACODYL 10 MG RE SUPP: 10.0000 mg | Freq: Every day | RECTAL | Status: DC | PRN

## 2015-09-22 MED ORDER — SODIUM CHLORIDE 0.9 % IV BOLUS (SEPSIS)
500.0000 mL | Freq: Once | INTRAVENOUS | Status: AC
  Administered 2015-09-22: 500 mL via INTRAVENOUS

## 2015-09-22 MED ORDER — METHOCARBAMOL 500 MG PO TABS: 500.0000 mg | ORAL_TABLET | Freq: Four times a day (QID) | ORAL | Status: DC | PRN

## 2015-09-22 MED ORDER — HYDROCODONE-ACETAMINOPHEN 5-325 MG PO TABS: 1.0000 | ORAL_TABLET | Freq: Four times a day (QID) | ORAL | Status: DC | PRN

## 2015-09-22 MED ORDER — POLYETHYLENE GLYCOL 3350 17 G PO PACK
17.0000 g | PACK | Freq: Every day | ORAL | Status: DC | PRN
  Filled 2015-09-22: qty 1

## 2015-09-22 MED ORDER — HEPARIN SODIUM (PORCINE) 5000 UNIT/ML IJ SOLN
5000.0000 [IU] | Freq: Three times a day (TID) | INTRAMUSCULAR | Status: DC
  Administered 2015-09-23 – 2015-09-26 (×9): 5000 [IU] via SUBCUTANEOUS
  Filled 2015-09-22 (×13): qty 1

## 2015-09-22 MED ORDER — HYDRALAZINE HCL 20 MG/ML IJ SOLN
10.0000 mg | INTRAMUSCULAR | Status: DC | PRN
  Administered 2015-09-23: 10 mg via INTRAVENOUS
  Filled 2015-09-22: qty 1

## 2015-09-22 MED ORDER — CARBIDOPA-LEVODOPA 25-100 MG PO TABS
1.0000 | ORAL_TABLET | Freq: Four times a day (QID) | ORAL | Status: DC
  Administered 2015-09-23 – 2015-09-26 (×11): 1 via ORAL
  Filled 2015-09-22 (×15): qty 1

## 2015-09-22 MED ORDER — MORPHINE SULFATE (PF) 4 MG/ML IV SOLN
4.0000 mg | Freq: Once | INTRAVENOUS | Status: AC
  Administered 2015-09-22: 4 mg via INTRAVENOUS
  Filled 2015-09-22: qty 1

## 2015-09-22 MED ORDER — MORPHINE SULFATE (PF) 2 MG/ML IV SOLN
0.5000 mg | INTRAVENOUS | Status: DC | PRN
  Administered 2015-09-23 (×2): 0.5 mg via INTRAVENOUS
  Filled 2015-09-22 (×2): qty 1

## 2015-09-22 MED ORDER — FINASTERIDE 5 MG PO TABS
5.0000 mg | ORAL_TABLET | Freq: Every day | ORAL | Status: DC
  Administered 2015-09-24 – 2015-09-26 (×3): 5 mg via ORAL
  Filled 2015-09-22 (×4): qty 1

## 2015-09-22 MED ORDER — AMLODIPINE BESYLATE 5 MG PO TABS
5.0000 mg | ORAL_TABLET | Freq: Every day | ORAL | Status: DC
  Administered 2015-09-24 – 2015-09-26 (×3): 5 mg via ORAL
  Filled 2015-09-22 (×4): qty 1

## 2015-09-22 MED ORDER — METHOCARBAMOL 1000 MG/10ML IJ SOLN
500.0000 mg | Freq: Four times a day (QID) | INTRAVENOUS | Status: DC | PRN
  Filled 2015-09-22: qty 5

## 2015-09-22 NOTE — Progress Notes (Signed)
CSW attempted to meet with patient at bedside. However, x-ray at bedside conducting procedure. CSW will attempt to meet with patient later.  Willette Brace [id number] ED CSW 09/22/2015 9:38 PM

## 2015-09-22 NOTE — ED Notes (Signed)
Per EMS, patient is from home.  Patient fell and hurt his left hip(patient states he fell on his left hip).  Denies LOC or hitting head. EMS states no deformities, rotation, shortening, or bruising of the left hip. Patient refused pain meds from EMS.  Patient states 5 out of 10.  BP:166/92 P:72 R:16

## 2015-09-22 NOTE — ED Notes (Signed)
Patient transported to X-ray 

## 2015-09-22 NOTE — H&P (Signed)
[facility] History and Physical  Jomes Popelka [id number] DOB: 02-22-1929 DOA: 09/22/2015  Referring physician: ED physician PCP: Henrine Screws, MD  Specialists:  Dr. Rexene Alberts, neurology   Chief Complaint:  Fall at home, left hip pain   HPI: Kevin Hendrix is a 82 y.o. male with PMH of Parkinson's disease, chronic atrial fibrillation, chronic kidney disease stage III, and hypertension who presents to the ED with left hip pain following a ground-level mechanical fall at home. Patient reports being in his usual state of health with no recent fever, chills, or illness. He was ambulating about his house when he stumbled, falling onto his left side. He denies any preceding chest pain, palpitations, lightheadedness, or syncope/presyncope. There was no head strike or loss of consciousness. Patient attributes the fall to Parkinson disease and associated gait disorder. There was immediate pain at the left hip upon falling and EMS was activated for transport to the hospital. Patient was noted to be stable in the field and there was no gross deformity identified by EMS.  In ED, patient was found to be afebrile, saturating well on room air, and with vital signs stable. Radiographs of the left hip depict a comminuted proximal left femur fracture. Blood work is notable for serum creatinine of 1.42, slightly up from his apparent baseline. EKG features atrial fibrillation with ventricular premature complexes. Urinalysis has trace hemoglobin, but is otherwise unremarkable. Orthopedic surgery was consulted from the emergency department and Dr. Sharol Given recommended admission to [facility] with tentative plans for surgical intervention by Dr. Ninfa Linden on 09/23/2015. Patient will be admitted to [facility] for ongoing evaluation and management of closed left proximal femur fracture.  Where does patient live?   At home     Can patient participate in ADLs?  Some   Review of Systems:   General: no fevers,  chills, sweats, weight change, poor appetite, or fatigue HEENT: no blurry vision, hearing changes or sore throat Pulm: no dyspnea, cough, or wheeze CV: no chest pain or palpitations Abd: no nausea, vomiting, abdominal pain, diarrhea, or constipation GU: no dysuria, hematuria, increased urinary frequency, or urgency  Ext: no leg edema Neuro: no focal weakness, numbness, or tingling, no vision change or hearing loss Skin: no rash, no wounds MSK: No muscle spasm, no deformity, no red, hot, or swollen joint Heme: No easy bruising or bleeding Travel history: No recent long distant travel    Allergy: No Known Allergies  Past Medical History  Diagnosis Date  . Hypertension   . Parkinson disease ([place])   . Recurrent falls 03/01/2013  . Parkinson's disease ([place]) 03/01/2013    Past Surgical History  Procedure Laterality Date  . Inguinal hernia repair    . Appendectomy    . Tonsillectomy    . Intramedullary (im) nail intertrochanteric Right 03/08/2013    Procedure: INTRAMEDULLARY (IM) NAIL INTERTROCHANTRIC;  Surgeon: Mcarthur Rossetti, MD;  Location: [place];  Service: Orthopedics;  Laterality: Right;    Social History:  reports that he quit smoking about 41 years ago. His smoking use included Cigarettes. He has never used smokeless tobacco. He reports that he does not drink alcohol or use illicit drugs.  Family History:  Family History  Problem Relation Age of Onset  . Diabetes Mother   . Diabetes Father      Prior to Admission medications   Medication Sig Start Date End Date Taking? Authorizing Provider  amLODipine (NORVASC) 5 MG tablet Take 5 mg by mouth daily.  07/24/15  Yes Historical  Provider, MD  aspirin 81 MG tablet Take 81 mg by mouth daily.   Yes Historical Provider, MD  carbidopa-levodopa (SINEMET IR) 25-100 MG tablet Take 1 tablet by mouth 4 (four) times daily. 09/10/15  Yes Star Age, MD  finasteride (PROSCAR) 5 MG tablet Take 5 mg by mouth daily.   Yes Historical  Provider, MD  furosemide (LASIX) 40 MG tablet Take 0.5 tablets (20 mg total) by mouth daily. 03/13/13  Yes Josetta Huddle, MD    Physical Exam: Filed Vitals:   09/22/15 1700 09/22/15 1735 09/22/15 1824 09/22/15 1947  BP: 182/94   185/86  Pulse: 62   83  Temp:  99.1 F (37.3 C)  99.3 F (37.4 C)  TempSrc:  Rectal    Resp: 22   19  Height:   5\' 6"  (1.676 m)   Weight:   72.122 kg (159 lb)   SpO2: 96%   97%   General: Not in acute distress HEENT:       Eyes: PERRL, EOMI, no scleral icterus or conjunctival pallor.       ENT: No discharge from the ears or nose, no pharyngeal ulcers, petechiae or exudate, no tonsillar enlargement.        Neck: No JVD, no bruit, no appreciable mass Heme: No cervical adenopathy, no pallor Cardiac: Rate ~60 and irregular, no significant murmurs, No gallops or rubs. Pulm: Good air movement bilaterally. No rales, wheezing, rhonchi or rubs. Abd: Soft, nondistended, nontender, no rebound pain or gaurding, no mass or organomegaly, BS present. Ext: No LE edema bilaterally. 2+DP/PT pulse bilaterally. Musculoskeletal: No gross deformity, no red, hot, swollen joints   Skin: No rashes or wounds on exposed surfaces  Neuro: Alert, oriented X3, cranial nerves II-XII grossly intact. No focal findings. Sensation to light touch intact and symmetric in b/l LEs distally  Psych: Patient is not overtly psychotic, appropriate mood and affect.  Labs on Admission:  Basic Metabolic Panel:  Recent Labs Lab 09/22/15 1829  NA 139  K 3.5  CL 101  CO2 28  GLUCOSE 110*  BUN 23*  CREATININE 1.42*  CALCIUM 9.0   Liver Function Tests:  Recent Labs Lab 09/22/15 1829  AST 21  ALT 7*  ALKPHOS 54  BILITOT 0.7  PROT 7.4  ALBUMIN 3.9   No results for input(s): LIPASE, AMYLASE in the last 168 hours. No results for input(s): AMMONIA in the last 168 hours. CBC:  Recent Labs Lab 09/22/15 1829  WBC 6.8  NEUTROABS 5.7  HGB 11.8*  HCT 35.3*  MCV 91.0  PLT 146*    Cardiac Enzymes: No results for input(s): CKTOTAL, CKMB, CKMBINDEX, TROPONINI in the last 168 hours.  BNP (last 3 results) No results for input(s): BNP in the last 8760 hours.  ProBNP (last 3 results) No results for input(s): PROBNP in the last 8760 hours.  CBG: No results for input(s): GLUCAP in the last 168 hours.  Radiological Exams on Admission: Dg Chest 1 View  09/22/2015  CLINICAL DATA:  Fall at home today, left hip pain. EXAM: CHEST 1 VIEW COMPARISON:  Chest x-rays dated 03/07/2013 and 09/16/2010. FINDINGS: Study is hypoinspiratory with crowding of the perihilar and bibasilar bronchovascular markings. Given the low lung volumes, lungs appear clear. No evidence of pneumonia. No pleural effusions seen. No pneumothorax seen. Heart size likely accentuated by the low lung volumes. Cardiomediastinal silhouette similar to the appearance on chest x-ray of 09/16/2010. Degenerative change again noted within the slightly scoliotic thoracic spine. Degenerative changes again noted  at each shoulder. Old healed rib fractures noted on the right. No acute -appearing osseous abnormality. IMPRESSION: 1. Hypoinspiratory exam. Given the low lung volumes, lungs appear clear. 2. Mediastinum appears widened, but this is likely accentuated by the low lung volumes. Appearance of the cardiomediastinal silhouette is similar to chest x-ray of 09/16/2010. Recommend repeat chest x-ray with better inspiratory effort to exclude a significant widening of the mediastinum. Electronically Signed   By: Franki Cabot M.D.   On: 09/22/2015 18:05   Dg Hip Unilat With Pelvis 2-3 Views Left  09/22/2015  CLINICAL DATA:  82 year old male who fell at home today. Left hip pain. Initial encounter. EXAM: DG HIP (WITH OR WITHOUT PELVIS) 2-3V LEFT COMPARISON:  None. FINDINGS: Impacted and comminuted basicervical versus intertrochanteric fracture with varus angulation. The left femoral head remains normally located. Chronic postoperative  changes in the proximal right femur with partially visible right femoral intramedullary rod. Proximal interlocking dynamic hip screw. Bulky dystrophic ossification in the proximal right femur. Stool ball in the rectum. Osteopenia at the pelvis. No acute pelvic fracture identified. IMPRESSION: Comminuted left femur basicervical versus intertrochanteric fracture with varus impaction. Electronically Signed   By: Genevie Ann M.D.   On: 09/22/2015 18:03    EKG: Independently reviewed.  Abnormal findings:  Atrial fibrillation, ventricular premature complex  Assessment/Plan  1. Left proximal femur fracture  - Suffered ground-level mechanical fall at home without head injury or LOC  - Radiographs reveal comminuted fx proximal left femur  - Appreciated orthopedic consultants, will admit to [facility] per request  - UA grossly clear, CXR needs to be repeated (pending), check INR (pending), type and screen (pending), EKG with chronic a fib, renal fxn and anemia appear stable, pt appears euvolemic  - Control pain; apply ice to left hip; keep NPO after MN  - Hold ASA 81, last dose am of 3/13; hold Lasix for now, pt is euvolemic and will be NPO after MN   2. Chronic atrial fibrillation  - CHADS-VASc 4, not on AC d/t recurrent falls  - Rate is well-controlled   3. Parkinson disease - Appears stable, continue Sinemet    4. CKD stage III - SCr 1.42 on admission, up from apparent baseline of ~1.2  - Hold Lasix for now as pt appears euvolemic  - Monitor; avoid nephrotoxins where feasible   5. Hypertension  - Currently elevated to 185/85 range, pain may be contributing  - Continue home-dose Norvasc 5 mg qD  - Treat SBP > 170, or DBP >90 with hydralazine 10 mg IVP prn overnight    6. Anemia  - Hgb 11.8 on admission, consistent with apparent baseline  - Suspected secondary to CKD  - No sign of active blood loss  - Type and screen pending     DVT ppx: SQ Heparin     Code Status: Full code Family  Communication:  Yes, patient's wife and grandaughter at bed side Disposition Plan: Admit to inpatient   Date of Service 09/22/2015    Vianne Bulls, MD [facility] Pager [phone]  If 7PM-7AM, please contact night-coverage [email or web address] Password [email or web address] 09/22/2015, 9:20 PM

## 2015-09-22 NOTE — ED Notes (Signed)
UNABLE TO COLLECT LABS PATIENT IS IN XRAY

## 2015-09-22 NOTE — ED Provider Notes (Signed)
CSN: [id number]     Arrival date & time 09/22/15  1632 History   First MD Initiated Contact with Patient 09/22/15 1703     Chief Complaint  Patient presents with  . Fall  . Hip Pain    left side     (Consider location/radiation/quality/duration/timing/severity/associated sxs/prior Treatment) HPI.Marland KitchenMarland KitchenMarland KitchenAccidental trip and fall today striking left hip.  No head or neck trauma. Family reports normal behavior. No prodromal illnesses. He fractured his right hip approximate urine half ago.  Severity of pain is moderate.  Past Medical History  Diagnosis Date  . Hypertension   . Parkinson disease ([place])   . Recurrent falls 03/01/2013  . Parkinson's disease ([place]) 03/01/2013   Past Surgical History  Procedure Laterality Date  . Inguinal hernia repair    . Appendectomy    . Tonsillectomy    . Intramedullary (im) nail intertrochanteric Right 03/08/2013    Procedure: INTRAMEDULLARY (IM) NAIL INTERTROCHANTRIC;  Surgeon: Mcarthur Rossetti, MD;  Location: [place];  Service: Orthopedics;  Laterality: Right;   Family History  Problem Relation Age of Onset  . Diabetes Mother   . Diabetes Father    Social History  Substance Use Topics  . Smoking status: Former Smoker    Types: Cigarettes    Quit date: 09/27/1974  . Smokeless tobacco: Never Used  . Alcohol Use: No    Review of Systems  All other systems reviewed and are negative.     Allergies  Review of patient's allergies indicates no known allergies.  Home Medications   Prior to Admission medications   Medication Sig Start Date End Date Taking? Authorizing Provider  amLODipine (NORVASC) 5 MG tablet Take 5 mg by mouth daily.  07/24/15  Yes Historical Provider, MD  aspirin 81 MG tablet Take 81 mg by mouth daily.   Yes Historical Provider, MD  carbidopa-levodopa (SINEMET IR) 25-100 MG tablet Take 1 tablet by mouth 4 (four) times daily. 09/10/15  Yes Star Age, MD  finasteride (PROSCAR) 5 MG tablet Take 5 mg by mouth daily.   Yes  Historical Provider, MD  furosemide (LASIX) 40 MG tablet Take 0.5 tablets (20 mg total) by mouth daily. 03/13/13  Yes Josetta Huddle, MD   BP 158/80 mmHg  Pulse 77  Temp(Src) 99.3 F (37.4 C) (Rectal)  Resp 24  Ht 5\' 6"  (1.676 m)  Wt 159 lb (72.122 kg)  BMI 25.68 kg/m2  SpO2 93% Physical Exam  Constitutional: He is oriented to person, place, and time.  Flat affect  HENT:  Head: Normocephalic and atraumatic.  Eyes: Conjunctivae and EOM are normal. Pupils are equal, round, and reactive to light.  Neck: Normal range of motion. Neck supple.  Cardiovascular: Normal rate and regular rhythm.   Pulmonary/Chest: Effort normal and breath sounds normal.  Abdominal: Soft. Bowel sounds are normal.  Musculoskeletal:  Severe tenderness surrounding left hip  Neurological: He is alert and oriented to person, place, and time.  Skin: Skin is warm and dry.  Psychiatric: He has a normal mood and affect. His behavior is normal.  Nursing note and vitals reviewed.   ED Course  Procedures (including critical care time) Labs Review Labs Reviewed  URINALYSIS, ROUTINE W REFLEX MICROSCOPIC (NOT AT [facility]) - Abnormal; Notable for the following:    Hgb urine dipstick TRACE (*)    All other components within normal limits  COMPREHENSIVE METABOLIC PANEL - Abnormal; Notable for the following:    Glucose, Bld 110 (*)    BUN 23 (*)  Creatinine, Ser 1.42 (*)    ALT 7 (*)    GFR calc non Af Amer 44 (*)    GFR calc Af Amer 51 (*)    All other components within normal limits  CBC WITH DIFFERENTIAL/PLATELET - Abnormal; Notable for the following:    RBC 3.88 (*)    Hemoglobin 11.8 (*)    HCT 35.3 (*)    Platelets 146 (*)    Lymphs Abs 0.6 (*)    All other components within normal limits  URINE MICROSCOPIC-ADD ON - Abnormal; Notable for the following:    Squamous Epithelial / LPF 0-5 (*)    All other components within normal limits  CULTURE, BLOOD (ROUTINE X 2)  CULTURE, BLOOD (ROUTINE X 2)  PROTIME-INR   APTT  CBC  BASIC METABOLIC PANEL  TYPE AND SCREEN  ABO/RH    Imaging Review Dg Chest 1 View  09/22/2015  CLINICAL DATA:  Fall at home today, left hip pain. EXAM: CHEST 1 VIEW COMPARISON:  Chest x-rays dated 03/07/2013 and 09/16/2010. FINDINGS: Study is hypoinspiratory with crowding of the perihilar and bibasilar bronchovascular markings. Given the low lung volumes, lungs appear clear. No evidence of pneumonia. No pleural effusions seen. No pneumothorax seen. Heart size likely accentuated by the low lung volumes. Cardiomediastinal silhouette similar to the appearance on chest x-ray of 09/16/2010. Degenerative change again noted within the slightly scoliotic thoracic spine. Degenerative changes again noted at each shoulder. Old healed rib fractures noted on the right. No acute -appearing osseous abnormality. IMPRESSION: 1. Hypoinspiratory exam. Given the low lung volumes, lungs appear clear. 2. Mediastinum appears widened, but this is likely accentuated by the low lung volumes. Appearance of the cardiomediastinal silhouette is similar to chest x-ray of 09/16/2010. Recommend repeat chest x-ray with better inspiratory effort to exclude a significant widening of the mediastinum. Electronically Signed   By: Franki Cabot M.D.   On: 09/22/2015 18:05   Chest Portable 1 View  09/22/2015  CLINICAL DATA:  Mediastinal widening EXAM: PORTABLE CHEST 1 VIEW COMPARISON:  1745 hours FINDINGS: The thorax is rotated to the right severely limiting evaluation of the mediastinum. Normal heart size. Lungs are clear. Right-sided rib deformities are stable. No pneumothorax. No pleural effusion. IMPRESSION: Limited examination. Lungs are clear. Incomplete evaluation of the mediastinum. Electronically Signed   By: Marybelle Killings M.D.   On: 09/22/2015 21:43   Dg Hip Unilat With Pelvis 2-3 Views Left  09/22/2015  CLINICAL DATA:  82 year old male who fell at home today. Left hip pain. Initial encounter. EXAM: DG HIP (WITH OR  WITHOUT PELVIS) 2-3V LEFT COMPARISON:  None. FINDINGS: Impacted and comminuted basicervical versus intertrochanteric fracture with varus angulation. The left femoral head remains normally located. Chronic postoperative changes in the proximal right femur with partially visible right femoral intramedullary rod. Proximal interlocking dynamic hip screw. Bulky dystrophic ossification in the proximal right femur. Stool ball in the rectum. Osteopenia at the pelvis. No acute pelvic fracture identified. IMPRESSION: Comminuted left femur basicervical versus intertrochanteric fracture with varus impaction. Electronically Signed   By: Genevie Ann M.D.   On: 09/22/2015 18:03   I have personally reviewed and evaluated these images and lab results as part of my medical decision-making.   EKG Interpretation   Date/Time:  Monday September 22 2015 17:09:25 EDT Ventricular Rate:  82 PR Interval:    QRS Duration: 129 QT Interval:  409 QTC Calculation: 478 R Axis:   12 Text Interpretation:  Atrial fibrillation Ventricular premature complex  Nonspecific  intraventricular conduction delay Inferior infarct, age  indeterminate Lateral leads are also involved Confirmed by Ariadne Rissmiller  MD, Boyd  [phone]) on 09/22/2015 7:00:08 PM      MDM   Final diagnoses:  Fall, initial encounter  Closed left hip fracture, initial encounter ([place])    Plain films of left hip show a comminuted left femur intertrochanteric fracture with varus impaction. Discussed with Dr. Sharol Given.  Admit to general medicine.  Will transfer patient to [facility].    Nat Christen, MD 09/23/15 [phone]

## 2015-09-22 NOTE — ED Notes (Signed)
MD at bedside. EDP COOK

## 2015-09-22 NOTE — Progress Notes (Signed)
[facility] consulted for possible home health services. EDCM spoke to patient and his wife at bedside.  Patient lives at home with his wife.  Patient would like to return home upon discharge.  Patient's wife reports patient has a walker, and a bedside commode.  Patient's wife reports the patient also has a wheelchair which was given to him and "it's bad."  Patient's wife reports patient will need a wheelchair.  She reports his wheelchair does not fit through the bathroom door.  Patient's wife confirms patient's pcp is Dr. Inda Merlin.  EDCM provided patient with list of home health agencies in [facility], explained services.  Patient and patient's family thankful for services.  Patient requesting pain medication.  EDCM asked patient's RN for pain medication for patient.  Patient's family requesting update.  EDCM discussed patient with EDP who reports patient to be admitted and will update patient and his family.  No further EDCM needs at this time.

## 2015-09-22 NOTE — ED Notes (Signed)
Bed: WA11 Expected date:  Expected time:  Means of arrival:  Comments: EMS-fall-hip pain 

## 2015-09-23 ENCOUNTER — Inpatient Hospital Stay (HOSPITAL_COMMUNITY): Payer: Medicare Other

## 2015-09-23 ENCOUNTER — Encounter (HOSPITAL_COMMUNITY): Payer: Self-pay | Admitting: Certified Registered"

## 2015-09-23 ENCOUNTER — Encounter (HOSPITAL_COMMUNITY)
Admission: EM | Disposition: A | Discharge status: Skilled Nursing Facility | Payer: Self-pay | Source: Home / Self Care | Attending: Internal Medicine

## 2015-09-23 ENCOUNTER — Inpatient Hospital Stay (HOSPITAL_COMMUNITY): Payer: Medicare Other | Admitting: Anesthesiology

## 2015-09-23 HISTORY — PX: INTRAMEDULLARY (IM) NAIL INTERTROCHANTERIC: SHX5875

## 2015-09-23 LAB — ABO/RH: ABO/RH(D): O POS

## 2015-09-23 LAB — CBC
HCT: 30.5 % — ABNORMAL LOW (ref 39.0–52.0)
Hemoglobin: 10.1 g/dL — ABNORMAL LOW (ref 13.0–17.0)
MCH: 31.5 pg (ref 26.0–34.0)
MCHC: 33.1 g/dL (ref 30.0–36.0)
MCV: 95 fL (ref 78.0–100.0)
PLATELETS: 123 10*3/uL — AB (ref 150–400)
RBC: 3.21 MIL/uL — AB (ref 4.22–5.81)
RDW: 13.7 % (ref 11.5–15.5)
WBC: 5.2 10*3/uL (ref 4.0–10.5)

## 2015-09-23 LAB — BASIC METABOLIC PANEL
ANION GAP: 7 (ref 5–15)
BUN: 19 mg/dL (ref 6–20)
CALCIUM: 8.5 mg/dL — AB (ref 8.9–10.3)
CO2: 29 mmol/L (ref 22–32)
Chloride: 104 mmol/L (ref 101–111)
Creatinine, Ser: 1.3 mg/dL — ABNORMAL HIGH (ref 0.61–1.24)
GFR calc Af Amer: 56 mL/min — ABNORMAL LOW (ref >60)
GFR, EST NON AFRICAN AMERICAN: 49 mL/min — AB (ref >60)
GLUCOSE: 96 mg/dL (ref 65–99)
Potassium: 3.3 mmol/L — ABNORMAL LOW (ref 3.5–5.1)
SODIUM: 140 mmol/L (ref 135–145)

## 2015-09-23 LAB — TYPE AND SCREEN
ABO/RH(D): O POS
ANTIBODY SCREEN: NEGATIVE

## 2015-09-23 SURGERY — FIXATION, FRACTURE, INTERTROCHANTERIC, WITH INTRAMEDULLARY ROD
Anesthesia: General | Site: Hip | Laterality: Left

## 2015-09-23 MED ORDER — MENTHOL 3 MG MT LOZG: 1.0000 | LOZENGE | OROMUCOSAL | Status: DC | PRN

## 2015-09-23 MED ORDER — MORPHINE SULFATE (PF) 2 MG/ML IV SOLN: 0.5000 mg | INTRAVENOUS | Status: DC | PRN

## 2015-09-23 MED ORDER — METOCLOPRAMIDE HCL 5 MG/ML IJ SOLN: 5.0000 mg | Freq: Three times a day (TID) | INTRAMUSCULAR | Status: DC | PRN

## 2015-09-23 MED ORDER — ASPIRIN EC 325 MG PO TBEC
325.0000 mg | DELAYED_RELEASE_TABLET | Freq: Every day | ORAL | Status: DC
  Administered 2015-09-24 – 2015-09-26 (×3): 325 mg via ORAL
  Filled 2015-09-23 (×3): qty 1

## 2015-09-23 MED ORDER — CEFAZOLIN SODIUM-DEXTROSE 2-3 GM-% IV SOLR
2.0000 g | Freq: Four times a day (QID) | INTRAVENOUS | Status: AC
  Administered 2015-09-23 – 2015-09-24 (×2): 2 g via INTRAVENOUS
  Filled 2015-09-23 (×2): qty 50

## 2015-09-23 MED ORDER — PROPOFOL 10 MG/ML IV BOLUS
INTRAVENOUS | Status: DC | PRN
  Administered 2015-09-23: 50 mg via INTRAVENOUS

## 2015-09-23 MED ORDER — HYDROMORPHONE HCL 1 MG/ML IJ SOLN
INTRAMUSCULAR | Status: AC
  Administered 2015-09-23: 0.25 mg via INTRAVENOUS
  Filled 2015-09-23: qty 1

## 2015-09-23 MED ORDER — SUCCINYLCHOLINE CHLORIDE 20 MG/ML IJ SOLN
INTRAMUSCULAR | Status: DC | PRN
  Administered 2015-09-23: 100 mg via INTRAVENOUS

## 2015-09-23 MED ORDER — FENTANYL CITRATE (PF) 250 MCG/5ML IJ SOLN
INTRAMUSCULAR | Status: AC
  Filled 2015-09-23: qty 5

## 2015-09-23 MED ORDER — PROPOFOL 10 MG/ML IV BOLUS
INTRAVENOUS | Status: AC
  Filled 2015-09-23: qty 20

## 2015-09-23 MED ORDER — DEXTROSE 5 % IV SOLN
500.0000 mg | Freq: Four times a day (QID) | INTRAVENOUS | Status: DC | PRN
  Filled 2015-09-23: qty 5

## 2015-09-23 MED ORDER — ACETAMINOPHEN 325 MG PO TABS
650.0000 mg | ORAL_TABLET | Freq: Four times a day (QID) | ORAL | Status: DC | PRN
Start: 2015-09-23 — End: 2015-09-26
  Administered 2015-09-24 (×2): 650 mg via ORAL
  Filled 2015-09-23 (×2): qty 2

## 2015-09-23 MED ORDER — LIDOCAINE HCL (CARDIAC) 20 MG/ML IV SOLN
INTRAVENOUS | Status: DC | PRN
  Administered 2015-09-23 (×2): 50 mg via INTRAVENOUS

## 2015-09-23 MED ORDER — 0.9 % SODIUM CHLORIDE (POUR BTL) OPTIME
TOPICAL | Status: DC | PRN
  Administered 2015-09-23: 1000 mL

## 2015-09-23 MED ORDER — LACTATED RINGERS IV SOLN
INTRAVENOUS | Status: DC | PRN
  Administered 2015-09-23: 15:00:00 via INTRAVENOUS

## 2015-09-23 MED ORDER — METOCLOPRAMIDE HCL 10 MG PO TABS: 5.0000 mg | ORAL_TABLET | Freq: Three times a day (TID) | ORAL | Status: DC | PRN

## 2015-09-23 MED ORDER — PHENOL 1.4 % MT LIQD: 1.0000 | OROMUCOSAL | Status: DC | PRN

## 2015-09-23 MED ORDER — ACETAMINOPHEN 650 MG RE SUPP: 650.0000 mg | Freq: Four times a day (QID) | RECTAL | Status: DC | PRN

## 2015-09-23 MED ORDER — HYDROMORPHONE HCL 1 MG/ML IJ SOLN
0.2500 mg | INTRAMUSCULAR | Status: DC | PRN
  Administered 2015-09-23: 0.5 mg via INTRAVENOUS
  Administered 2015-09-23 (×2): 0.25 mg via INTRAVENOUS

## 2015-09-23 MED ORDER — CEFAZOLIN SODIUM-DEXTROSE 2-3 GM-% IV SOLR
INTRAVENOUS | Status: DC | PRN
  Administered 2015-09-23: 2 g via INTRAVENOUS

## 2015-09-23 MED ORDER — ONDANSETRON HCL 4 MG/2ML IJ SOLN: 4.0000 mg | Freq: Four times a day (QID) | INTRAMUSCULAR | Status: DC | PRN

## 2015-09-23 MED ORDER — ONDANSETRON HCL 4 MG PO TABS: 4.0000 mg | ORAL_TABLET | Freq: Four times a day (QID) | ORAL | Status: DC | PRN

## 2015-09-23 MED ORDER — HYDROCODONE-ACETAMINOPHEN 5-325 MG PO TABS: 1.0000 | ORAL_TABLET | Freq: Four times a day (QID) | ORAL | Status: DC | PRN

## 2015-09-23 MED ORDER — PROMETHAZINE HCL 25 MG/ML IJ SOLN: 6.2500 mg | INTRAMUSCULAR | Status: DC | PRN

## 2015-09-23 MED ORDER — METHOCARBAMOL 500 MG PO TABS: 500.0000 mg | ORAL_TABLET | Freq: Four times a day (QID) | ORAL | Status: DC | PRN

## 2015-09-23 SURGICAL SUPPLY — 49 items
BANDAGE ACE 4X5 VEL STRL LF (GAUZE/BANDAGES/DRESSINGS) ×1 IMPLANT
BLADE SURG 15 STRL LF DISP TIS (BLADE) IMPLANT
BLADE SURG 15 STRL SS (BLADE)
BNDG COHESIVE 4X5 TAN NS LF (GAUZE/BANDAGES/DRESSINGS) ×1 IMPLANT
BNDG GAUZE ELAST 4 BULKY (GAUZE/BANDAGES/DRESSINGS) ×2 IMPLANT
COVER PERINEAL POST (MISCELLANEOUS) ×2 IMPLANT
COVER SURGICAL LIGHT HANDLE (MISCELLANEOUS) ×2 IMPLANT
DRAPE PROXIMA HALF (DRAPES) IMPLANT
DRAPE STERI IOBAN 125X83 (DRAPES) ×2 IMPLANT
DRSG MEPILEX BORDER 4X4 (GAUZE/BANDAGES/DRESSINGS) ×1 IMPLANT
DRSG MEPILEX BORDER 4X8 (GAUZE/BANDAGES/DRESSINGS) IMPLANT
DRSG PAD ABDOMINAL 8X10 ST (GAUZE/BANDAGES/DRESSINGS) IMPLANT
DURAPREP 26ML APPLICATOR (WOUND CARE) ×2 IMPLANT
ELECT REM PT RETURN 9FT ADLT (ELECTROSURGICAL) ×2
ELECTRODE REM PT RTRN 9FT ADLT (ELECTROSURGICAL) ×1 IMPLANT
FACESHIELD WRAPAROUND (MASK) ×2 IMPLANT
FACESHIELD WRAPAROUND OR TEAM (MASK) ×1 IMPLANT
GAUZE XEROFORM 1X8 LF (GAUZE/BANDAGES/DRESSINGS) ×1 IMPLANT
GAUZE XEROFORM 5X9 LF (GAUZE/BANDAGES/DRESSINGS) ×1 IMPLANT
GLOVE BIO SURGEON STRL SZ8 (GLOVE) ×2 IMPLANT
GLOVE BIOGEL PI IND STRL 8 (GLOVE) ×1 IMPLANT
GLOVE BIOGEL PI INDICATOR 8 (GLOVE) ×1
GLOVE ORTHO TXT STRL SZ7.5 (GLOVE) ×2 IMPLANT
GOWN STRL REUS W/ TWL LRG LVL3 (GOWN DISPOSABLE) ×1 IMPLANT
GOWN STRL REUS W/ TWL XL LVL3 (GOWN DISPOSABLE) ×2 IMPLANT
GOWN STRL REUS W/TWL LRG LVL3 (GOWN DISPOSABLE) ×2
GOWN STRL REUS W/TWL XL LVL3 (GOWN DISPOSABLE) ×4
GUIDE PIN 3.2X343 (PIN) ×2
GUIDE PIN 3.2X343MM (PIN) ×4
KIT BASIN OR (CUSTOM PROCEDURE TRAY) ×2 IMPLANT
KIT ROOM TURNOVER OR (KITS) ×2 IMPLANT
LINER BOOT UNIVERSAL DISP (MISCELLANEOUS) ×2 IMPLANT
MANIFOLD NEPTUNE II (INSTRUMENTS) IMPLANT
NAIL TRIGEN 10MMX40CM-125 LEFT (Nail) ×1 IMPLANT
NS IRRIG 1000ML POUR BTL (IV SOLUTION) ×2 IMPLANT
PACK GENERAL/GYN (CUSTOM PROCEDURE TRAY) ×2 IMPLANT
PAD ARMBOARD 7.5X6 YLW CONV (MISCELLANEOUS) ×3 IMPLANT
PAD CAST 4YDX4 CTTN HI CHSV (CAST SUPPLIES) ×2 IMPLANT
PADDING CAST COTTON 4X4 STRL (CAST SUPPLIES)
PIN GUIDE 3.2X343MM (PIN) IMPLANT
SCREW LAG COMPR KIT 90/85 (Screw) ×1 IMPLANT
STAPLER VISISTAT 35W (STAPLE) ×2 IMPLANT
SUT VIC AB 0 CT1 27 (SUTURE) ×2
SUT VIC AB 0 CT1 27XBRD ANBCTR (SUTURE) ×1 IMPLANT
SUT VIC AB 2-0 CT1 27 (SUTURE) ×2
SUT VIC AB 2-0 CT1 TAPERPNT 27 (SUTURE) ×1 IMPLANT
TOWEL OR 17X24 6PK STRL BLUE (TOWEL DISPOSABLE) ×2 IMPLANT
TOWEL OR 17X26 10 PK STRL BLUE (TOWEL DISPOSABLE) ×2 IMPLANT
WATER STERILE IRR 1000ML POUR (IV SOLUTION) ×1 IMPLANT

## 2015-09-23 NOTE — Progress Notes (Signed)
[facility] PROGRESS NOTE  Kevin Hendrix [id number] DOB: 1928-10-20 DOA: 09/22/2015 PCP: Henrine Screws, MD  Brief Summary  The patient is an 82 year old male with history of Parkinson's disease, chronic atrial fibrillation, chronic kidney disease stage III, hypertension who presented to the emergency department after a mechanical fall resulted in left hip pain.  In the emergency department, he was found to have a comminuted proximal left femur fracture.  Assessment/Plan  Left proximal femur fracture after mechanical fall. Although from a cardiovascular standpoint patient is moderate risk for complication, due to his progressive Parkinson's disease and frailty, his risk of perioperative complication is considerably higher. He is at risk for delirium, dysphagia, aspiration, ileus.  He spends more than 90% of his day in a chair or in bed and ambulates only a limited distance. We discussed the possibility of no surgery, however the patient and family would like to proceed with surgery at this time. -  Continue pain management -  Bowel regimen -   DVT prophylaxis per orthopedic surgery    Parkinson's disease, stable, but patient has considerable slowing, gait instability at baseline - Please resume Sinemet postoperatively as quickly as possible -  He will need speech therapy evaluation post surgery and would give him a dysphagia 1 diet postoperatively when diet advanced  Chronic atrial fibrillation,  -  CHADS-VASc 4, not on AC d/t recurrent falls   CKD stage III, baseline creatinine of about 1.2 -  Hold Lasix, minimize nephrotoxins  Essential hypertension, blood pressures initially very elevated secondary to pain - Continue home-dose Norvasc 5 mg qD  - Treat SBP > 170, or DBP >90 with hydralazine 10 mg IVP  Anemia  - Hgb 11.8 on admission, consistent with apparent baseline  - Suspected secondary to CKD  - No sign of active blood loss   Diet:  NPO Access:   PIV IVF:  yes Proph:  SCDs  Code Status: full Family Communication: patient, his wife, daughter Disposition Plan:  Surgery tonight.  Anticipate discharge to SNF over weekend if no immediate complications    Consultants:  Orthopedic surgery, Dr. Ninfa Linden  Procedures:  Hip x-ray  Chest x-ray  Antibiotics:  Perioperative   HPI/Subjective:  Patient continues to have severe pain on his left lateral hip, denies SOB, chest pain.  Does have occasional constipation  Objective: Filed Vitals:   09/23/15 1722 09/23/15 1737 09/23/15 1753 09/23/15 1808  BP: 150/86 161/88 150/80 162/84  Pulse: 89 73 73 82  Temp:      TempSrc:      Resp: 17 21 14 20   Height:      Weight:      SpO2: 100% 99% 100% 100%    Intake/Output Summary (Last 24 hours) at 09/23/15 1842 Last data filed at 09/23/15 1648  Gross per 24 hour  Intake    800 ml  Output    625 ml  Net    175 ml   Filed Weights   09/22/15 1824  Weight: 72.122 kg (159 lb)   Body mass index is 25.68 kg/(m^2).  Exam:   General:  Frail appearing adult male, decreased facial expression from Parkinson's, slow to answer and rigid, No acute distress  HEENT:  NCAT, MMM  Cardiovascular: I RRR, nl S1, S2 no mrg, 2+ pulses, warm extremities  Respiratory:  CTAB, no increased WOB  Abdomen:   NABS, soft, NT/ND  MSK:   Increased tone and decreased bulk, 2+ pulses, less than 2 second capillary refill, able to wiggle toes  and ankle of the left extremity. Tender to palpation over the left lateral hip.  Neuro: Cogwheel rigidity, no focal deficits, left leg limited by pain  Data Reviewed: Basic Metabolic Panel:  Recent Labs Lab 09/22/15 0534 09/22/15 1829  NA 140 139  K 3.3* 3.5  CL 104 101  CO2 29 28  GLUCOSE 96 110*  BUN 19 23*  CREATININE 1.30* 1.42*  CALCIUM 8.5* 9.0   Liver Function Tests:  Recent Labs Lab 09/22/15 1829  AST 21  ALT 7*  ALKPHOS 54  BILITOT 0.7  PROT 7.4  ALBUMIN 3.9   No results for  input(s): LIPASE, AMYLASE in the last 168 hours. No results for input(s): AMMONIA in the last 168 hours. CBC:  Recent Labs Lab 09/22/15 0534 09/22/15 1829  WBC 5.2 6.8  NEUTROABS  --  5.7  HGB 10.1* 11.8*  HCT 30.5* 35.3*  MCV 95.0 91.0  PLT 123* 146*    Recent Results (from the past 240 hour(s))  Blood culture (routine x 2)     Status: None (Preliminary result)   Collection Time: 09/22/15  6:20 PM  Result Value Ref Range Status   Specimen Description BLOOD RIGHT ARM  Final   Special Requests BOTTLES DRAWN AEROBIC AND ANAEROBIC 5CC  Final   Culture   Final    NO GROWTH < 24 HOURS Performed at [facility]    Report Status PENDING  Incomplete  Blood culture (routine x 2)     Status: None (Preliminary result)   Collection Time: 09/22/15  6:30 PM  Result Value Ref Range Status   Specimen Description BLOOD LEFT ARM  Final   Special Requests BOTTLES DRAWN AEROBIC AND ANAEROBIC 5CC  Final   Culture   Final    NO GROWTH < 24 HOURS Performed at [facility]    Report Status PENDING  Incomplete     Studies: Dg Chest 1 View  09/22/2015  CLINICAL DATA:  Fall at home today, left hip pain. EXAM: CHEST 1 VIEW COMPARISON:  Chest x-rays dated 03/07/2013 and 09/16/2010. FINDINGS: Study is hypoinspiratory with crowding of the perihilar and bibasilar bronchovascular markings. Given the low lung volumes, lungs appear clear. No evidence of pneumonia. No pleural effusions seen. No pneumothorax seen. Heart size likely accentuated by the low lung volumes. Cardiomediastinal silhouette similar to the appearance on chest x-ray of 09/16/2010. Degenerative change again noted within the slightly scoliotic thoracic spine. Degenerative changes again noted at each shoulder. Old healed rib fractures noted on the right. No acute -appearing osseous abnormality. IMPRESSION: 1. Hypoinspiratory exam. Given the low lung volumes, lungs appear clear. 2. Mediastinum appears widened, but this is likely  accentuated by the low lung volumes. Appearance of the cardiomediastinal silhouette is similar to chest x-ray of 09/16/2010. Recommend repeat chest x-ray with better inspiratory effort to exclude a significant widening of the mediastinum. Electronically Signed   By: Franki Cabot M.D.   On: 09/22/2015 18:05   Chest Portable 1 View  09/22/2015  CLINICAL DATA:  Mediastinal widening EXAM: PORTABLE CHEST 1 VIEW COMPARISON:  1745 hours FINDINGS: The thorax is rotated to the right severely limiting evaluation of the mediastinum. Normal heart size. Lungs are clear. Right-sided rib deformities are stable. No pneumothorax. No pleural effusion. IMPRESSION: Limited examination. Lungs are clear. Incomplete evaluation of the mediastinum. Electronically Signed   By: Marybelle Killings M.D.   On: 09/22/2015 21:43   Dg C-arm 1-60 Min  09/23/2015  CLINICAL DATA:  Fixation of intertrochanteric  left femur fracture. EXAM: DG C-ARM 61-120 MIN; LEFT FEMUR 2 VIEWS fluoroscopic time 36 seconds COMPARISON:  September 22, 2015 FINDINGS: The fluoroscopic images demonstrate interval fixation of the left intertrochanteric fracture with a femoral rod fixated by screws in the proximal femur. There is no malalignment. IMPRESSION: Interval fixation of left intertrochanteric fracture without malalignment. Electronically Signed   By: Abelardo Diesel M.D.   On: 09/23/2015 16:50   Dg Hip Unilat With Pelvis 2-3 Views Left  09/22/2015  CLINICAL DATA:  82 year old male who fell at home today. Left hip pain. Initial encounter. EXAM: DG HIP (WITH OR WITHOUT PELVIS) 2-3V LEFT COMPARISON:  None. FINDINGS: Impacted and comminuted basicervical versus intertrochanteric fracture with varus angulation. The left femoral head remains normally located. Chronic postoperative changes in the proximal right femur with partially visible right femoral intramedullary rod. Proximal interlocking dynamic hip screw. Bulky dystrophic ossification in the proximal right femur. Stool  ball in the rectum. Osteopenia at the pelvis. No acute pelvic fracture identified. IMPRESSION: Comminuted left femur basicervical versus intertrochanteric fracture with varus impaction. Electronically Signed   By: Genevie Ann M.D.   On: 09/22/2015 18:03   Dg Femur Min 2 Views Left  09/23/2015  CLINICAL DATA:  Fixation of intertrochanteric left femur fracture. EXAM: DG C-ARM 61-120 MIN; LEFT FEMUR 2 VIEWS fluoroscopic time 36 seconds COMPARISON:  September 22, 2015 FINDINGS: The fluoroscopic images demonstrate interval fixation of the left intertrochanteric fracture with a femoral rod fixated by screws in the proximal femur. There is no malalignment. IMPRESSION: Interval fixation of left intertrochanteric fracture without malalignment. Electronically Signed   By: Abelardo Diesel M.D.   On: 09/23/2015 16:50    Scheduled Meds: . [MAR Hold] amLODipine  5 mg Oral Daily  . [MAR Hold] carbidopa-levodopa  1 tablet Oral QID  . [MAR Hold] finasteride  5 mg Oral Daily  . [MAR Hold] heparin  5,000 Units Subcutaneous 3 times per day   Continuous Infusions:   Principal Problem:   Hip fracture (HCC) Active Problems:   Parkinson's disease (HCC)   HTN (hypertension)   Anemia   Atrial fibrillation (HCC)   CKD (chronic kidney disease) stage 3, GFR 30-59 ml/min   Closed left hip fracture ([place])    Time spent: 30 min    Aleeyah Bensen, [facility] Pager [phone]. If 7PM-7AM, please contact night-coverage at [email or web address], password [facility] 09/23/2015, 6:42 PM  LOS: 1 day

## 2015-09-23 NOTE — Progress Notes (Signed)
Patient ID: Kevin Hendrix, male   DOB: 18-Sep-1928, 82 y.o.   MRN: [id number] The patient is to be transferred to [facility] today for surgery this evening to fix his broken left hip.  This is since I am at [facility] all day with 5 cases already and will allow for more expedited care.  I will see him at [facility] upon transfer today.

## 2015-09-23 NOTE — ED Notes (Signed)
Pt to go to OR at 1 pm

## 2015-09-23 NOTE — Anesthesia Procedure Notes (Signed)
Procedure Name: Intubation Date/Time: 09/23/2015 3:53 PM Performed by: Eligha Bridegroom Pre-anesthesia Checklist: Emergency Drugs available, Patient identified, Timeout performed, Patient being monitored and Suction available Patient Re-evaluated:Patient Re-evaluated prior to inductionOxygen Delivery Method: Circle system utilized Preoxygenation: Pre-oxygenation with 100% oxygen Intubation Type: IV induction Laryngoscope Size: Mac and 4 Grade View: Grade III Tube type: Oral Tube size: 7.5 mm Number of attempts: 1 Airway Equipment and Method: Stylet and LTA kit utilized Secured at: 22 cm Tube secured with: Tape Dental Injury: Teeth and Oropharynx as per pre-operative assessment

## 2015-09-23 NOTE — Consult Note (Signed)
Reason for Consult:  Left hip fracture Referring Physician: EDP  Kevin Hendrix is an 82 y.o. male.  HPI:   82 yo male with multiple medical problems and a history of multiple falls.  Sustained a mechanical fall late yesterday and was transported to the ED where his was found to have a displaced left hip fracture.  Ortho was consulted to address his injury.  I was then contacted since I took care of his other hip in 2014 when he sustained a similar fracture after a mechanical fall.  He does reports left hip pain.  Family is at the bedside.  Past Medical History  Diagnosis Date  . Hypertension   . Parkinson disease ([place])   . Recurrent falls 03/01/2013  . Parkinson's disease ([place]) 03/01/2013    Past Surgical History  Procedure Laterality Date  . Inguinal hernia repair    . Appendectomy    . Tonsillectomy    . Intramedullary (im) nail intertrochanteric Right 03/08/2013    Procedure: INTRAMEDULLARY (IM) NAIL INTERTROCHANTRIC;  Surgeon: Mcarthur Rossetti, MD;  Location: [place];  Service: Orthopedics;  Laterality: Right;    Family History  Problem Relation Age of Onset  . Diabetes Mother   . Diabetes Father     Social History:  reports that he quit smoking about 41 years ago. His smoking use included Cigarettes. He has never used smokeless tobacco. He reports that he does not drink alcohol or use illicit drugs.  Allergies: No Known Allergies  Medications: I have reviewed the patient's current medications.  Results for orders placed or performed during the hospital encounter of 09/22/15 (from the past 48 hour(s))  CBC     Status: Abnormal   Collection Time: 09/22/15  5:34 AM  Result Value Ref Range   WBC 5.2 4.0 - 10.5 K/uL   RBC 3.21 (L) 4.22 - 5.81 MIL/uL   Hemoglobin 10.1 (L) 13.0 - 17.0 g/dL   HCT 30.5 (L) 39.0 - 52.0 %   MCV 95.0 78.0 - 100.0 fL   MCH 31.5 26.0 - 34.0 pg   MCHC 33.1 30.0 - 36.0 g/dL   RDW 13.7 11.5 - 15.5 %   Platelets 123 (L) 150 - 400 K/uL  Basic  metabolic panel     Status: Abnormal   Collection Time: 09/22/15  5:34 AM  Result Value Ref Range   Sodium 140 135 - 145 mmol/L   Potassium 3.3 (L) 3.5 - 5.1 mmol/L   Chloride 104 101 - 111 mmol/L   CO2 29 22 - 32 mmol/L   Glucose, Bld 96 65 - 99 mg/dL   BUN 19 6 - 20 mg/dL   Creatinine, Ser 1.30 (H) 0.61 - 1.24 mg/dL   Calcium 8.5 (L) 8.9 - 10.3 mg/dL   GFR calc non Af Amer 49 (L) >60 mL/min   GFR calc Af Amer 56 (L) >60 mL/min    Comment: (NOTE) The eGFR has been calculated using the CKD EPI equation. This calculation has not been validated in all clinical situations. eGFR's persistently <60 mL/min signify possible Chronic Kidney Disease.    Anion gap 7 5 - 15  Urinalysis, Routine w reflex microscopic (not at [facility])     Status: Abnormal   Collection Time: 09/22/15  5:30 PM  Result Value Ref Range   Color, Urine YELLOW YELLOW   APPearance CLEAR CLEAR   Specific Gravity, Urine 1.010 1.005 - 1.030   pH 7.0 5.0 - 8.0   Glucose, UA NEGATIVE NEGATIVE mg/dL  Hgb urine dipstick TRACE (A) NEGATIVE   Bilirubin Urine NEGATIVE NEGATIVE   Ketones, ur NEGATIVE NEGATIVE mg/dL   Protein, ur NEGATIVE NEGATIVE mg/dL   Nitrite NEGATIVE NEGATIVE   Leukocytes, UA NEGATIVE NEGATIVE  Urine microscopic-add on     Status: Abnormal   Collection Time: 09/22/15  5:30 PM  Result Value Ref Range   Squamous Epithelial / LPF 0-5 (A) NONE SEEN   WBC, UA NONE SEEN 0 - 5 WBC/hpf   RBC / HPF 0-5 0 - 5 RBC/hpf   Bacteria, UA NONE SEEN NONE SEEN  Blood culture (routine x 2)     Status: None (Preliminary result)   Collection Time: 09/22/15  6:20 PM  Result Value Ref Range   Specimen Description BLOOD RIGHT ARM    Special Requests BOTTLES DRAWN AEROBIC AND ANAEROBIC 5CC    Culture      NO GROWTH < 24 HOURS Performed at [facility]    Report Status PENDING   Comprehensive metabolic panel     Status: Abnormal   Collection Time: 09/22/15  6:29 PM  Result Value Ref Range   Sodium 139 135 - 145  mmol/L   Potassium 3.5 3.5 - 5.1 mmol/L   Chloride 101 101 - 111 mmol/L   CO2 28 22 - 32 mmol/L   Glucose, Bld 110 (H) 65 - 99 mg/dL   BUN 23 (H) 6 - 20 mg/dL   Creatinine, Ser 1.42 (H) 0.61 - 1.24 mg/dL   Calcium 9.0 8.9 - 10.3 mg/dL   Total Protein 7.4 6.5 - 8.1 g/dL   Albumin 3.9 3.5 - 5.0 g/dL   AST 21 15 - 41 U/L   ALT 7 (L) 17 - 63 U/L   Alkaline Phosphatase 54 38 - 126 U/L   Total Bilirubin 0.7 0.3 - 1.2 mg/dL   GFR calc non Af Amer 44 (L) >60 mL/min   GFR calc Af Amer 51 (L) >60 mL/min    Comment: (NOTE) The eGFR has been calculated using the CKD EPI equation. This calculation has not been validated in all clinical situations. eGFR's persistently <60 mL/min signify possible Chronic Kidney Disease.    Anion gap 10 5 - 15  CBC with Differential     Status: Abnormal   Collection Time: 09/22/15  6:29 PM  Result Value Ref Range   WBC 6.8 4.0 - 10.5 K/uL   RBC 3.88 (L) 4.22 - 5.81 MIL/uL   Hemoglobin 11.8 (L) 13.0 - 17.0 g/dL   HCT 35.3 (L) 39.0 - 52.0 %   MCV 91.0 78.0 - 100.0 fL   MCH 30.4 26.0 - 34.0 pg   MCHC 33.4 30.0 - 36.0 g/dL   RDW 13.5 11.5 - 15.5 %   Platelets 146 (L) 150 - 400 K/uL   Neutrophils Relative % 83 %   Neutro Abs 5.7 1.7 - 7.7 K/uL   Lymphocytes Relative 9 %   Lymphs Abs 0.6 (L) 0.7 - 4.0 K/uL   Monocytes Relative 8 %   Monocytes Absolute 0.5 0.1 - 1.0 K/uL   Eosinophils Relative 0 %   Eosinophils Absolute 0.0 0.0 - 0.7 K/uL   Basophils Relative 0 %   Basophils Absolute 0.0 0.0 - 0.1 K/uL  Blood culture (routine x 2)     Status: None (Preliminary result)   Collection Time: 09/22/15  6:30 PM  Result Value Ref Range   Specimen Description BLOOD LEFT ARM    Special Requests BOTTLES DRAWN AEROBIC AND ANAEROBIC 5CC  Culture      NO GROWTH < 24 HOURS Performed at [facility]    Report Status PENDING   Protime-INR     Status: None   Collection Time: 09/22/15  9:40 PM  Result Value Ref Range   Prothrombin Time 15.2 11.6 - 15.2  seconds   INR 1.24 0.00 - 1.49  APTT     Status: None   Collection Time: 09/22/15  9:40 PM  Result Value Ref Range   aPTT 30 24 - 37 seconds  Type and screen [place]     Status: None   Collection Time: 09/22/15  9:50 PM  Result Value Ref Range   ABO/RH(D) O POS    Antibody Screen NEG    Sample Expiration 09/25/2015   ABO/Rh     Status: None   Collection Time: 09/22/15  9:50 PM  Result Value Ref Range   ABO/RH(D) O POS     Dg Chest 1 View  09/22/2015  CLINICAL DATA:  Fall at home today, left hip pain. EXAM: CHEST 1 VIEW COMPARISON:  Chest x-rays dated 03/07/2013 and 09/16/2010. FINDINGS: Study is hypoinspiratory with crowding of the perihilar and bibasilar bronchovascular markings. Given the low lung volumes, lungs appear clear. No evidence of pneumonia. No pleural effusions seen. No pneumothorax seen. Heart size likely accentuated by the low lung volumes. Cardiomediastinal silhouette similar to the appearance on chest x-ray of 09/16/2010. Degenerative change again noted within the slightly scoliotic thoracic spine. Degenerative changes again noted at each shoulder. Old healed rib fractures noted on the right. No acute -appearing osseous abnormality. IMPRESSION: 1. Hypoinspiratory exam. Given the low lung volumes, lungs appear clear. 2. Mediastinum appears widened, but this is likely accentuated by the low lung volumes. Appearance of the cardiomediastinal silhouette is similar to chest x-ray of 09/16/2010. Recommend repeat chest x-ray with better inspiratory effort to exclude a significant widening of the mediastinum. Electronically Signed   By: Franki Cabot M.D.   On: 09/22/2015 18:05   Chest Portable 1 View  09/22/2015  CLINICAL DATA:  Mediastinal widening EXAM: PORTABLE CHEST 1 VIEW COMPARISON:  1745 hours FINDINGS: The thorax is rotated to the right severely limiting evaluation of the mediastinum. Normal heart size. Lungs are clear. Right-sided rib deformities are  stable. No pneumothorax. No pleural effusion. IMPRESSION: Limited examination. Lungs are clear. Incomplete evaluation of the mediastinum. Electronically Signed   By: Marybelle Killings M.D.   On: 09/22/2015 21:43   Dg Hip Unilat With Pelvis 2-3 Views Left  09/22/2015  CLINICAL DATA:  82 year old male who fell at home today. Left hip pain. Initial encounter. EXAM: DG HIP (WITH OR WITHOUT PELVIS) 2-3V LEFT COMPARISON:  None. FINDINGS: Impacted and comminuted basicervical versus intertrochanteric fracture with varus angulation. The left femoral head remains normally located. Chronic postoperative changes in the proximal right femur with partially visible right femoral intramedullary rod. Proximal interlocking dynamic hip screw. Bulky dystrophic ossification in the proximal right femur. Stool ball in the rectum. Osteopenia at the pelvis. No acute pelvic fracture identified. IMPRESSION: Comminuted left femur basicervical versus intertrochanteric fracture with varus impaction. Electronically Signed   By: Genevie Ann M.D.   On: 09/22/2015 18:03    ROS Blood pressure 177/151, pulse 58, temperature 99.3 F (37.4 C), temperature source Rectal, resp. rate 15, height _0  (1.676 m), weight 72.122 kg (159 lb), SpO2 94 %. Physical Exam  Constitutional: He appears well-developed.  HENT:  Head: Normocephalic.  Neck: Normal range of motion.  Cardiovascular: Normal rate.   Respiratory: Effort normal.  GI: Soft.  Musculoskeletal:       Left hip: He exhibits decreased range of motion, decreased strength, tenderness, bony tenderness and deformity.  Neurological: He is alert.  Skin: Skin is warm.    Assessment/Plan: Displaced and angulated left high intertrochanteric hip fracture 1)  I spoke with him and his family in length and am recommeding surgery to address his left hip.  They understand fully the risks of non-operative and operative treatment given his multiple medical problems.  They have elected to proceed with  surgery in hopes of decreasing his pain and improving his quality of life.  Will proceed with surgery today.  Joreen Swearingin Y 09/23/2015, 3:25 PM

## 2015-09-23 NOTE — ED Notes (Signed)
Pt pending transfer to [facility] once bed available.

## 2015-09-23 NOTE — Transfer of Care (Signed)
Immediate Anesthesia Transfer of Care Note  Patient: Kevin Hendrix  Procedure(s) Performed: Procedure(s): INTRAMEDULLARY (IM) NAIL INTERTROCHANTRIC (Left)  Patient Location: PACU  Anesthesia Type:General  Level of Consciousness: awake and alert   Airway & Oxygen Therapy: Patient Spontanous Breathing and Patient connected to nasal cannula oxygen  Post-op Assessment: Report given to RN and Post -op Vital signs reviewed and stable  Post vital signs: Reviewed and stable  Last Vitals:  Filed Vitals:   09/23/15 1200 09/23/15 1300  BP: 165/75 177/151  Pulse: 69 58  Temp:    Resp: 14 15    Complications: No apparent anesthesia complications

## 2015-09-23 NOTE — Brief Op Note (Signed)
09/22/2015 - 09/23/2015  4:40 PM  PATIENT:  Kevin Hendrix  82 y.o. male  PRE-OPERATIVE DIAGNOSIS:  left hip fracture  POST-OPERATIVE DIAGNOSIS:  left hip fracture  PROCEDURE:  Procedure(s): INTRAMEDULLARY (IM) NAIL INTERTROCHANTRIC (Left)  SURGEON:  Surgeon(s) and Role:    * Mcarthur Rossetti, MD - Primary  PHYSICIAN ASSISTANT: Benita Stabile, PA-C  ANESTHESIA:   general  EBL:  Total I/O In: -  Out: 550 [Urine:550]  COUNTS:  YES  TOURNIQUET:  * No tourniquets in log *  DICTATION: .Other Dictation: Dictation Number [id number]  PLAN OF CARE: Admit to inpatient   PATIENT DISPOSITION:  PACU - hemodynamically stable.   Delay start of Pharmacological VTE agent (>24hrs) due to surgical blood loss or risk of bleeding: no

## 2015-09-23 NOTE — Anesthesia Preprocedure Evaluation (Signed)
Anesthesia Evaluation  Patient identified by MRN, date of birth, ID band Patient awake    Reviewed: Allergy & Precautions, NPO status , Patient's Chart, lab work & pertinent test results  Airway Mallampati: II  TM Distance: >3 FB Neck ROM: Full    Dental no notable dental hx.    Pulmonary neg pulmonary ROS, former smoker,    Pulmonary exam normal breath sounds clear to auscultation       Cardiovascular hypertension, Normal cardiovascular exam Rhythm:Regular Rate:Normal     Neuro/Psych parkinsons dz negative psych ROS   GI/Hepatic negative GI ROS, Neg liver ROS,   Endo/Other  negative endocrine ROS  Renal/GU Renal InsufficiencyRenal disease  negative genitourinary   Musculoskeletal negative musculoskeletal ROS (+)   Abdominal   Peds negative pediatric ROS (+)  Hematology negative hematology ROS (+)   Anesthesia Other Findings   Reproductive/Obstetrics negative OB ROS                             Anesthesia Physical Anesthesia Plan  ASA: III  Anesthesia Plan: General   Post-op Pain Management:    Induction: Intravenous  Airway Management Planned: Oral ETT  Additional Equipment:   Intra-op Plan:   Post-operative Plan: Extubation in OR  Informed Consent: I have reviewed the patients History and Physical, chart, labs and discussed the procedure including the risks, benefits and alternatives for the proposed anesthesia with the patient or authorized representative who has indicated his/her understanding and acceptance.   Dental advisory given  Plan Discussed with: CRNA and Surgeon  Anesthesia Plan Comments:         Anesthesia Quick Evaluation

## 2015-09-23 NOTE — Anesthesia Postprocedure Evaluation (Signed)
Anesthesia Post Note  Patient: Kevin Hendrix  Procedure(s) Performed: Procedure(s) (LRB): INTRAMEDULLARY (IM) NAIL INTERTROCHANTRIC (Left)  Patient location during evaluation: PACU Anesthesia Type: General Level of consciousness: awake and alert and patient cooperative Pain management: pain level controlled Vital Signs Assessment: post-procedure vital signs reviewed and stable Respiratory status: spontaneous breathing and respiratory function stable Cardiovascular status: stable Anesthetic complications: no    Last Vitals:  Filed Vitals:   09/23/15 1853 09/23/15 1908  BP: 134/64 138/71  Pulse: 67 88  Temp:    Resp: 12 13    Last Pain:  Filed Vitals:   09/23/15 1909  PainSc: [place]

## 2015-09-23 NOTE — ED Notes (Signed)
Pt transferred [facility]. Pt placement notified per house ac request

## 2015-09-24 ENCOUNTER — Telehealth (HOSPITAL_BASED_OUTPATIENT_CLINIC_OR_DEPARTMENT_OTHER): Payer: Self-pay | Admitting: Emergency Medicine

## 2015-09-24 ENCOUNTER — Encounter (HOSPITAL_COMMUNITY): Payer: Self-pay | Admitting: Orthopaedic Surgery

## 2015-09-24 DIAGNOSIS — D500 Iron deficiency anemia secondary to blood loss (chronic): Secondary | ICD-10-CM

## 2015-09-24 DIAGNOSIS — S72002C Fracture of unspecified part of neck of left femur, initial encounter for open fracture type IIIA, IIIB, or IIIC: Secondary | ICD-10-CM

## 2015-09-24 DIAGNOSIS — I480 Paroxysmal atrial fibrillation: Secondary | ICD-10-CM

## 2015-09-24 LAB — BASIC METABOLIC PANEL
ANION GAP: 9 (ref 5–15)
BUN: 16 mg/dL (ref 6–20)
CALCIUM: 8.5 mg/dL — AB (ref 8.9–10.3)
CO2: 29 mmol/L (ref 22–32)
Chloride: 102 mmol/L (ref 101–111)
Creatinine, Ser: 1.23 mg/dL (ref 0.61–1.24)
GFR, EST NON AFRICAN AMERICAN: 52 mL/min — AB (ref >60)
GLUCOSE: 125 mg/dL — AB (ref 65–99)
POTASSIUM: 3.7 mmol/L (ref 3.5–5.1)
Sodium: 140 mmol/L (ref 135–145)

## 2015-09-24 LAB — CBC
HEMATOCRIT: 31.1 % — AB (ref 39.0–52.0)
HEMOGLOBIN: 10.3 g/dL — AB (ref 13.0–17.0)
MCH: 31 pg (ref 26.0–34.0)
MCHC: 33.1 g/dL (ref 30.0–36.0)
MCV: 93.7 fL (ref 78.0–100.0)
Platelets: 110 10*3/uL — ABNORMAL LOW (ref 150–400)
RBC: 3.32 MIL/uL — AB (ref 4.22–5.81)
RDW: 13.6 % (ref 11.5–15.5)
WBC: 7.7 10*3/uL (ref 4.0–10.5)

## 2015-09-24 MED ORDER — RESOURCE THICKENUP CLEAR PO POWD
ORAL | Status: DC | PRN
  Filled 2015-09-24 (×2): qty 125

## 2015-09-24 NOTE — Discharge Instructions (Signed)
Full weight bearing as tolerated left hip, but only up with assistance. Can get incisions wet daily in the shower. New dry dressings to left hip incisions daily as needed.

## 2015-09-24 NOTE — Evaluation (Signed)
Physical Therapy Evaluation Patient Details Name: Kevin Hendrix MRN: [id number] DOB: 05-12-1929 Today's Date: 09/24/2015   History of Present Illness  82 y.o. male with h/o multiple falls, R hip fx Aug 2014 s/p ORIF, Parkinsons admitted with L hip fx, s/p IM nail 09/23/15.  Clinical Impression  Pt admitted with above diagnosis. Pt currently with functional limitations due to the deficits listed below (see PT Problem List). +2 assist for supine to sit and to pivot to recliner. Pt puts forth good effort. SNF recommended.  Pt will benefit from skilled PT to increase their independence and safety with mobility to allow discharge to the venue listed below.       Follow Up Recommendations SNF;Supervision/Assistance - 24 hour    Equipment Recommendations  None recommended by PT    Recommendations for Other Services       Precautions / Restrictions Precautions Precautions: Fall Precaution Comments: multiple falls per chart Restrictions Weight Bearing Restrictions: No Other Position/Activity Restrictions: WBAT LLE      Mobility  Bed Mobility Overal bed mobility: Needs Assistance;+2 for physical assistance Bed Mobility: Supine to Sit     Supine to sit: +2 for physical assistance;Max assist     General bed mobility comments: assist to raise trunk and advance BLEs, pt 40%  Transfers Overall transfer level: Needs assistance Equipment used: Rolling walker (2 wheeled) Transfers: Sit to/from [employer] Transfers Sit to Stand: +2 physical assistance;Max assist Stand pivot transfers: +2 physical assistance;Min assist;+2 safety/equipment       General transfer comment: max A to rise, pt 50%, verbal/manual cues for positioning in RW and hand placement for several pivotal steps to recliner with RW, increased time  Ambulation/Gait                Stairs            Wheelchair Mobility    Modified Rankin (Stroke Patients Only)       Balance Overall balance  assessment: Needs assistance;History of Falls   Sitting balance-Leahy Scale: Poor Sitting balance - Comments: posterior lean, requires BLE and UE support, not able to lean forward past neutral with assist for forward reach Postural control: Posterior lean Standing balance support: Bilateral upper extremity supported Standing balance-Leahy Scale: Poor Standing balance comment: relies on BUE support                             Pertinent Vitals/Pain Pain Assessment: Faces Faces Pain Scale: Hurts a little bit Pain Location: L hip Pain Descriptors / Indicators: Sore Pain Intervention(s): RN gave pain meds during session;Ice applied;Monitored during session;Limited activity within patient's tolerance    Home Living Family/patient expects to be discharged to:: Private residence Living Arrangements: Spouse/significant other Available Help at Discharge: Personal care attendant;Family   Home Access: Stairs to enter   Entrance Stairs-Number of Steps: 4 Home Layout: One level Home Equipment: [place] - 2 wheels;Cane - single point;Bedside commode;Wheelchair - manual      Prior Function Level of Independence: Needs assistance   Gait / Transfers Assistance Needed: uses RW or cane for ambulation  ADL's / Homemaking Assistance Needed: aide comes 6x/week        Hand Dominance        Extremity/Trunk Assessment   Upper Extremity Assessment: Defer to OT evaluation           Lower Extremity Assessment: LLE deficits/detail   LLE Deficits / Details: hip/knee AAROM WFL, ankle at least 3/5  Cervical / Trunk Assessment: Kyphotic  Communication   Communication: HOH  Cognition Arousal/Alertness: Awake/alert Behavior During Therapy: Flat affect Overall Cognitive Status: Difficult to assess                      General Comments      Exercises General Exercises - Lower Extremity Ankle Circles/Pumps: AROM;AAROM;Both;10 reps;Supine Heel Slides: AAROM;Both;10  reps;Supine Hip ABduction/ADduction: AAROM;Both;10 reps;Supine      Assessment/Plan    PT Assessment Patient needs continued PT services  PT Diagnosis Difficulty walking;Acute pain;Generalized weakness   PT Problem List Decreased strength;Decreased range of motion;Decreased activity tolerance;Decreased balance;Pain;Decreased mobility  PT Treatment Interventions DME instruction;Gait training;Functional mobility training;Therapeutic activities;Therapeutic exercise;Balance training   PT Goals (Current goals can be found in the Care Plan section) Acute Rehab PT Goals Patient Stated Goal: none stated PT Goal Formulation: With patient Time For Goal Achievement: 10/08/15 Potential to Achieve Goals: Good    Frequency Min 3X/week   Barriers to discharge        Co-evaluation               End of Session Equipment Utilized During Treatment: Gait belt   Patient left: in chair;with call bell/phone within reach;with chair alarm set Nurse Communication: Mobility status         Time: [id number] PT Time Calculation (min) (ACUTE ONLY): 26 min   Charges:   PT Evaluation $PT Eval Moderate Complexity: 1 Procedure     PT G Codes:        Philomena Doheny 09/24/2015, 9:39 AM [phone]

## 2015-09-24 NOTE — Progress Notes (Signed)
Initial Nutrition Assessment  DOCUMENTATION CODES:   Severe malnutrition in context of chronic illness  INTERVENTION:   -Magic Cup TID with meals  NUTRITION DIAGNOSIS:   Malnutrition related to chronic illness as evidenced by severe depletion of body fat, severe depletion of muscle mass.  GOAL:   Patient will meet greater than or equal to 90% of their needs  MONITOR:   PO intake, Supplement acceptance, Labs, Weight trends, Skin, I & O's  REASON FOR ASSESSMENT:   Consult, [place] Hip fracture protocol  ASSESSMENT:   Kevin Hendrix is a 82 y.o. male with PMH of Parkinson's disease, chronic atrial fibrillation, chronic kidney disease stage III, and hypertension who presents to the ED with left hip pain following a ground-level mechanical fall at home.  Pt admitted with lt proximal femur fx.   S/p Procedure(s): 09/23/15 INTRAMEDULLARY (IM) NAIL INTERTROCHANTRIC (Left)  Pt sitting in recliner at bedside. No family at bedside. Pt was initially conversant at time of visit, however, did not answer most questions when RD initiated interview.   Pt suspects he has lost weight, but unable to confirm amount of time frame. Noted in wt hx that UBW around 140#, however, no recent wt hx available to assess.   SLP following; pt has been advanced to a dysphagia 2 diet with honey thick liquids. Suspect poor po intake PTA.   Nutrition-Focused physical exam completed. Findings are moderate to severe fat depletion, moderate to severe muscle depletion, and no edema.   Labs reviewed.   Diet Order:  DIET DYS 2 Room service appropriate?: Yes; Fluid consistency:: Honey Thick  Skin:  Wound (see comment) (closed lt hip incision)  Last BM:  09/22/15  Height:   Ht Readings from Last 1 Encounters:  09/22/15 5\' 6"  (1.676 m)    Weight:   Wt Readings from Last 1 Encounters:  09/22/15 159 lb (72.122 kg)    Ideal Body Weight:  64.5 kg  BMI:  Body mass index is 25.68  kg/(m^2).  Estimated Nutritional Needs:   Kcal:  1600-1800  Protein:  85-95 grams  Fluid:  1.6-1.8 L  EDUCATION NEEDS:   Education needs addressed  Jessamine Barcia A. Jimmye Norman, RD, LDN, CDE Pager: [phone] After hours Pager: [phone]

## 2015-09-24 NOTE — Care Management Note (Addendum)
Case Management Note  Patient Details  Name: Kevin Hendrix MRN: [id number] Date of Birth: Oct 10, 1928  Subjective/Objective:      Admitted with L femur fx ,s/p fall.  Hx of Parkinson's disease, chronic atrial fibrillation, chronic kidney disease stage III, hypertension. Resides with wife.    Action/Plan: Per PT/OT recommendations: SNF. Plan to d/c to [facility] 09/25/2015.  Expected Discharge Date:   09/25/2015           Expected Discharge Plan:  Skilled Nursing Facility  In-House Referral:  Clinical Social Work  Discharge planning Services  CM Consult  Post Acute Care Choice:    Choice offered to:     DME Arranged:    DME Agency:     HH Arranged:    [place] Agency:     Status of Service:    Medicare Important Message Given:    Date Medicare IM Given:    Medicare IM give by:    Date Additional Medicare IM Given:    Additional Medicare Important Message give by:     If discussed at [place] of Stay Meetings, dates discussed:    Additional Comments:  Sharin Mons, [place] [phone] 09/24/2015, 2:14 PM

## 2015-09-24 NOTE — Evaluation (Signed)
Clinical/Bedside Swallow Evaluation Patient Details  Name: Kevin Hendrix MRN: [id number] Date of Birth: 01-11-1929  Today's Date: 09/24/2015 Time: SLP Start Time (ACUTE ONLY): [id number] SLP Stop Time (ACUTE ONLY): 0953 SLP Time Calculation (min) (ACUTE ONLY): 24 min  Past Medical History:  Past Medical History  Diagnosis Date  . Hypertension   . Parkinson disease ([place])   . Recurrent falls 03/01/2013  . Parkinson's disease ([place]) 03/01/2013   Past Surgical History:  Past Surgical History  Procedure Laterality Date  . Inguinal hernia repair    . Appendectomy    . Tonsillectomy    . Intramedullary (im) nail intertrochanteric Right 03/08/2013    Procedure: INTRAMEDULLARY (IM) NAIL INTERTROCHANTRIC;  Surgeon: Mcarthur Rossetti, MD;  Location: [place];  Service: Orthopedics;  Laterality: Right;   HPI:  82 y.o. male with h/o Parkinson's disease, chronic atrial fibrillation, CKD stage III and HTN, who presented to ED with L hip pain following ground-level mechanical fall at home. CXR 3/13 limited examination. Lungs clear. MBS completed 09/2010. Results not available.    Assessment / Plan / Recommendation Clinical Impression  Pt presented with wet vocal quality and low vocal intensity upon SLP arrival. Deficits observed include poor awareness of bolus, delayed oral transit, oral residue, impaired mastication and multiple swallows. Immediate coughing x1 exhibited with intake of thin liquid indicative of possible airway compromise. Trials of nectar thick liquids x3 attempted however unsuccessful to diminish s/s aspiration. SLP provided max verbal and tactile cues for pt to clear throat. Pt educated re: further instrumental testing and diet recommendation of Dysphagia 2 (fine chop) textures, honey thick liquids, meds crushed in puree and check oral cavity for pocketed food. SLP will perform MBS next date to objectively determine aspiration risk and advise diet tolerance.    Aspiration Risk  Mild  aspiration risk    Diet Recommendation Dysphagia 2 (Fine chop);Honey-thick liquid   Liquid Administration via: Cup;No straw Medication Administration: Crushed with puree Supervision: Full supervision/cueing for compensatory strategies;Staff to assist with self feeding Compensations: Minimize environmental distractions;Slow rate;Small sips/bites;Clear throat intermittently Postural Changes: Seated upright at 90 degrees    Other  Recommendations Oral Care Recommendations: Oral care BID Other Recommendations: Order thickener from pharmacy;Remove water pitcher;Prohibited food (jello, ice cream, thin soups)   Follow up Recommendations   (TBD)    Frequency and Duration            Prognosis Prognosis for Safe Diet Advancement: Fair Barriers to Reach Goals: Cognitive deficits;Severity of deficits      Swallow Study   General HPI: 82 y.o. male with h/o Parkinson's disease, chronic atrial fibrillation, CKD stage III and HTN, who presented to ED with L hip pain following ground-level mechanical fall at home. CXR 3/13 limited examination. Lungs clear. MBS completed 09/2010. Results not available.  Type of Study: Bedside Swallow Evaluation Previous Swallow Assessment: see HPI Diet Prior to this Study: Thin liquids (clear liquid diet) Temperature Spikes Noted: No Respiratory Status: Nasal cannula History of Recent Intubation: No Behavior/Cognition: Alert;Cooperative;Confused;Requires cueing;Distractible Oral Cavity Assessment: Within Functional Limits Oral Care Completed by SLP: No Oral Cavity - Dentition: Dentures, top Vision: Impaired for self-feeding Self-Feeding Abilities: Needs set up;Needs assist;Able to feed self Patient Positioning: Upright in chair Baseline Vocal Quality: Wet;Low vocal intensity Volitional Cough: Weak Volitional Swallow: Able to elicit    Oral/Motor/Sensory Function Overall Oral Motor/Sensory Function: Generalized oral weakness   Ice Chips Ice chips: Not tested    Thin Liquid Thin Liquid: Impaired Presentation: Cup;Self Fed Oral Phase  Impairments: Poor awareness of bolus;Reduced labial seal Oral Phase Functional Implications: Prolonged oral transit;Right anterior spillage;Left anterior spillage Pharyngeal  Phase Impairments: Multiple swallows;Wet Vocal Quality;Cough - Immediate    Nectar Thick Nectar Thick Liquid: Impaired Presentation: Cup Oral Phase Impairments: Poor awareness of bolus Oral phase functional implications: Prolonged oral transit Pharyngeal Phase Impairments: Wet Vocal Quality;Multiple swallows   Honey Thick Honey Thick Liquid: Not tested   Puree Puree: Not tested   Solid   GO   Solid: Impaired Presentation: Self Fed Oral Phase Impairments: Poor awareness of bolus;Impaired mastication Oral Phase Functional Implications: Impaired mastication;Oral residue;Prolonged oral transit Pharyngeal Phase Impairments: Multiple swallows        Teniyah Seivert 09/24/2015,11:12 AM   Titus Mould, Student-SLP

## 2015-09-24 NOTE — Progress Notes (Addendum)
[facility] PROGRESS NOTE  Kevin Hendrix [id number] DOB: 26-Apr-1929 DOA: 09/22/2015 PCP: Henrine Screws, MD  Brief Summary  The patient is an 82 year old male with history of Parkinson's disease, chronic atrial fibrillation, chronic kidney disease stage III, hypertension who presented to the emergency department after a mechanical fall resulted in left hip pain.  In the emergency department, he was found to have a comminuted proximal left femur fracture.  Assessment/Plan  Left proximal femur fracture after mechanical fall. Although from a cardiovascular standpoint patient is moderate risk for complication, due to his progressive Parkinson's disease and frailty, his risk of perioperative complication is considerably higher. He is at risk for delirium, dysphagia, aspiration, ileus.  He spends more than 90% of his day in a chair or in bed and ambulates only a limited distance. We discussed the possibility of no surgery, however the patient and family would like to proceed with surgery at this time. -  Continue pain management -  Bowel regimen -   DVT prophylaxis per orthopedic surgery , currently on heparin   Parkinson's disease, stable, but patient has considerable slowing, gait instability at baseline   resume Sinemet postoperatively as quickly as possible   speech therapy evaluation post surgery and would give him a dysphagia 1 diet postoperatively when diet advanced  Chronic atrial fibrillation,  -  CHADS-VASc 4, not on AC d/t recurrent falls   CKD stage III, baseline creatinine of about 1.2 -  Hold Lasix, minimize nephrotoxins, improved   Essential hypertension, blood pressures initially very elevated secondary to pain - Continue home-dose Norvasc 5 mg qD  - Treat SBP > 170, or DBP >90 with hydralazine 10 mg IVP  ABLA  - Hgb 11.8>10.3   - Suspected secondary to CKD  - No sign of active blood loss , follow   Diet:  NPO Access:  PIV IVF:  yes Proph:   SCDs  Code Status: full Family Communication: patient, his wife, daughter Disposition Plan:   .  Anticipate discharge to SNF over weekend if no immediate complications    Consultants:  Orthopedic surgery, Dr. Ninfa Linden  Procedures:  Hip x-ray  Chest x-ray  Antibiotics:  Perioperative   HPI/Subjective:  Has  severe pain on his left lateral hip, denies SOB, chest pain.  Somewhat confused  Objective: Filed Vitals:   09/23/15 1938 09/23/15 2012 09/24/15 0554 09/24/15 0929  BP: 138/65 131/65 137/70   Pulse: 72 70 65   Temp:  98.4 F (36.9 C) 98.5 F (36.9 C)   TempSrc:  Oral Oral   Resp: 19 16 16    Height:      Weight:      SpO2: 100% 100% 100% 93%    Intake/Output Summary (Last 24 hours) at 09/24/15 1102 Last data filed at 09/24/15 0558  Gross per 24 hour  Intake    900 ml  Output    975 ml  Net    -75 ml   Filed Weights   09/22/15 1824  Weight: 72.122 kg (159 lb)   Body mass index is 25.68 kg/(m^2).  Exam:   General:  Frail appearing adult male, decreased facial expression from Parkinson's, slow to answer and rigid, No acute distress  HEENT:  NCAT, MMM  Cardiovascular: I RRR, nl S1, S2 no mrg, 2+ pulses, warm extremities  Respiratory:  CTAB, no increased WOB  Abdomen:   NABS, soft, NT/ND  MSK:   Increased tone and decreased bulk, 2+ pulses, less than 2 second capillary refill, able to wiggle  toes and ankle of the left extremity. Tender to palpation over the left lateral hip.  Neuro: Cogwheel rigidity, no focal deficits, left leg limited by pain  Data Reviewed: Basic Metabolic Panel:  Recent Labs Lab 09/22/15 0534 09/22/15 1829 09/24/15 0500  NA 140 139 140  K 3.3* 3.5 3.7  CL 104 101 102  CO2 29 28 29   GLUCOSE 96 110* 125*  BUN 19 23* 16  CREATININE 1.30* 1.42* 1.23  CALCIUM 8.5* 9.0 8.5*   Liver Function Tests:  Recent Labs Lab 09/22/15 1829  AST 21  ALT 7*  ALKPHOS 54  BILITOT 0.7  PROT 7.4  ALBUMIN 3.9   No results for  input(s): LIPASE, AMYLASE in the last 168 hours. No results for input(s): AMMONIA in the last 168 hours. CBC:  Recent Labs Lab 09/22/15 0534 09/22/15 1829 09/24/15 0500  WBC 5.2 6.8 7.7  NEUTROABS  --  5.7  --   HGB 10.1* 11.8* 10.3*  HCT 30.5* 35.3* 31.1*  MCV 95.0 91.0 93.7  PLT 123* 146* 110*    Recent Results (from the past 240 hour(s))  Blood culture (routine x 2)     Status: None (Preliminary result)   Collection Time: 09/22/15  6:20 PM  Result Value Ref Range Status   Specimen Description BLOOD RIGHT ARM  Final   Special Requests BOTTLES DRAWN AEROBIC AND ANAEROBIC 5CC  Final   Culture   Final    NO GROWTH < 24 HOURS Performed at [facility]    Report Status PENDING  Incomplete  Blood culture (routine x 2)     Status: None (Preliminary result)   Collection Time: 09/22/15  6:30 PM  Result Value Ref Range Status   Specimen Description BLOOD LEFT ARM  Final   Special Requests BOTTLES DRAWN AEROBIC AND ANAEROBIC 5CC  Final   Culture   Final    NO GROWTH < 24 HOURS Performed at [facility]    Report Status PENDING  Incomplete     Studies: Dg Chest 1 View  09/22/2015  CLINICAL DATA:  Fall at home today, left hip pain. EXAM: CHEST 1 VIEW COMPARISON:  Chest x-rays dated 03/07/2013 and 09/16/2010. FINDINGS: Study is hypoinspiratory with crowding of the perihilar and bibasilar bronchovascular markings. Given the low lung volumes, lungs appear clear. No evidence of pneumonia. No pleural effusions seen. No pneumothorax seen. Heart size likely accentuated by the low lung volumes. Cardiomediastinal silhouette similar to the appearance on chest x-ray of 09/16/2010. Degenerative change again noted within the slightly scoliotic thoracic spine. Degenerative changes again noted at each shoulder. Old healed rib fractures noted on the right. No acute -appearing osseous abnormality. IMPRESSION: 1. Hypoinspiratory exam. Given the low lung volumes, lungs appear clear. 2.  Mediastinum appears widened, but this is likely accentuated by the low lung volumes. Appearance of the cardiomediastinal silhouette is similar to chest x-ray of 09/16/2010. Recommend repeat chest x-ray with better inspiratory effort to exclude a significant widening of the mediastinum. Electronically Signed   By: Franki Cabot M.D.   On: 09/22/2015 18:05   Chest Portable 1 View  09/22/2015  CLINICAL DATA:  Mediastinal widening EXAM: PORTABLE CHEST 1 VIEW COMPARISON:  1745 hours FINDINGS: The thorax is rotated to the right severely limiting evaluation of the mediastinum. Normal heart size. Lungs are clear. Right-sided rib deformities are stable. No pneumothorax. No pleural effusion. IMPRESSION: Limited examination. Lungs are clear. Incomplete evaluation of the mediastinum. Electronically Signed   By: Marybelle Killings  M.D.   On: 09/22/2015 21:43   Dg C-arm 1-60 Min  09/23/2015  CLINICAL DATA:  Fixation of intertrochanteric left femur fracture. EXAM: DG C-ARM 61-120 MIN; LEFT FEMUR 2 VIEWS fluoroscopic time 36 seconds COMPARISON:  September 22, 2015 FINDINGS: The fluoroscopic images demonstrate interval fixation of the left intertrochanteric fracture with a femoral rod fixated by screws in the proximal femur. There is no malalignment. IMPRESSION: Interval fixation of left intertrochanteric fracture without malalignment. Electronically Signed   By: Abelardo Diesel M.D.   On: 09/23/2015 16:50   Dg Hip Unilat With Pelvis 2-3 Views Left  09/22/2015  CLINICAL DATA:  83 year old male who fell at home today. Left hip pain. Initial encounter. EXAM: DG HIP (WITH OR WITHOUT PELVIS) 2-3V LEFT COMPARISON:  None. FINDINGS: Impacted and comminuted basicervical versus intertrochanteric fracture with varus angulation. The left femoral head remains normally located. Chronic postoperative changes in the proximal right femur with partially visible right femoral intramedullary rod. Proximal interlocking dynamic hip screw. Bulky dystrophic  ossification in the proximal right femur. Stool ball in the rectum. Osteopenia at the pelvis. No acute pelvic fracture identified. IMPRESSION: Comminuted left femur basicervical versus intertrochanteric fracture with varus impaction. Electronically Signed   By: Genevie Ann M.D.   On: 09/22/2015 18:03   Dg Femur Min 2 Views Left  09/23/2015  CLINICAL DATA:  Fixation of intertrochanteric left femur fracture. EXAM: DG C-ARM 61-120 MIN; LEFT FEMUR 2 VIEWS fluoroscopic time 36 seconds COMPARISON:  September 22, 2015 FINDINGS: The fluoroscopic images demonstrate interval fixation of the left intertrochanteric fracture with a femoral rod fixated by screws in the proximal femur. There is no malalignment. IMPRESSION: Interval fixation of left intertrochanteric fracture without malalignment. Electronically Signed   By: Abelardo Diesel M.D.   On: 09/23/2015 16:50    Scheduled Meds: . amLODipine  5 mg Oral Daily  . aspirin EC  325 mg Oral Q breakfast  . carbidopa-levodopa  1 tablet Oral QID  . finasteride  5 mg Oral Daily  . heparin  5,000 Units Subcutaneous 3 times per day   Continuous Infusions:   Principal Problem:   Hip fracture (HCC) Active Problems:   Parkinson's disease (H[facility])   HTN (hypertension)   Anemia   Atrial fibrillation (HCC)   CKD (chronic kidney disease) stage 3, GFR 30-59 ml/min   Closed left hip fracture ([place])    Time spent: 30 min    [facility] [facility] Pager [phone]. If 7PM-7AM, please contact night-coverage at [email or web address], password [facility] 09/24/2015, 11:02 AM  LOS: 2 days

## 2015-09-24 NOTE — NC FL2 (Signed)
[place] MEDICAID FL2 LEVEL OF CARE SCREENING TOOL     IDENTIFICATION  Patient Name: Kevin Hendrix Birthdate: January 24, 1929 Sex: male Admission Date (Current Location): 09/22/2015  [facility] and [place] Number:  Herbalist and Address:  [facility], [place] [address], [place], [place] [address]      Provider Number: [id number]  Attending Physician Name and Address:  Reyne Dumas, MD  Relative Name and Phone Number:  Caren Griffins daughter, [phone]    Current Level of Care: Hospital Recommended Level of Care: [place] Prior Approval Number:    Date Approved/Denied:   PASRR Number: [id number]  Discharge Plan: SNF    Current Diagnoses: Patient Active Problem List   Diagnosis Date Noted  . Atrial fibrillation ([place]) 09/22/2015  . CKD (chronic kidney disease) stage 3, GFR 30-59 ml/min 09/22/2015  . Closed left hip fracture ([place]) 09/22/2015  . Fall   . Hip fracture ([place]) 03/07/2013  . HTN (hypertension) 03/07/2013  . Anemia 03/07/2013  . Recurrent falls 03/01/2013  . Parkinson's disease ([place]) 03/01/2013    Orientation RESPIRATION BLADDER Height & Weight     Self, Place  O2 (Nasal cannula 2L) Incontinent, External catheter (External urinary catheter) Weight:  (unable to get weight due to pt PACU BED NOT ZEROED) Height:  5\' 6"  (167.6 cm)  BEHAVIORAL SYMPTOMS/MOOD NEUROLOGICAL BOWEL NUTRITION STATUS      Continent  (Please see DC summary)  AMBULATORY STATUS COMMUNICATION OF NEEDS Skin   Extensive Assist Verbally Surgical wounds (Closed incision on hip)                       Personal Care Assistance Level of Assistance  Bathing, Feeding, Dressing Bathing Assistance: Maximum assistance Feeding assistance: Limited assistance Dressing Assistance: Limited assistance     Functional Limitations Info             SPECIAL CARE FACTORS FREQUENCY  PT (By licensed PT), OT (By licensed OT)     PT Frequency: min  3x/week OT Frequency: min 2x/week            Contractures      Additional Factors Info  Code Status, Allergies Code Status Info: Full Allergies Info: NKA           Current Medications (09/24/2015):  This is the current hospital active medication list Current Facility-Administered Medications  Medication Dose Route Frequency Provider Last Rate Last Dose  . acetaminophen (TYLENOL) tablet 650 mg  650 mg Oral Q6H PRN Mcarthur Rossetti, MD   650 mg at 09/24/15 0902   Or  . acetaminophen (TYLENOL) suppository 650 mg  650 mg Rectal Q6H PRN Mcarthur Rossetti, MD      . amLODipine (NORVASC) tablet 5 mg  5 mg Oral Daily Vianne Bulls, MD   5 mg at 09/24/15 0837  . aspirin EC tablet 325 mg  325 mg Oral Q breakfast Mcarthur Rossetti, MD   325 mg at 09/24/15 0837  . bisacodyl (DULCOLAX) suppository 10 mg  10 mg Rectal Daily PRN Vianne Bulls, MD      . carbidopa-levodopa (SINEMET IR) 25-100 MG per tablet immediate release 1 tablet  1 tablet Oral QID Vianne Bulls, MD   1 tablet at 09/24/15 0837  . finasteride (PROSCAR) tablet 5 mg  5 mg Oral Daily Vianne Bulls, MD   5 mg at 09/24/15 0837  . heparin injection 5,000 Units  5,000 Units Subcutaneous 3 times  per day Vianne Bulls, MD   5,000 Units at 09/24/15 [phone]  . hydrALAZINE (APRESOLINE) injection 10 mg  10 mg Intravenous Q4H PRN Vianne Bulls, MD   10 mg at 09/23/15 1327  . HYDROcodone-acetaminophen (NORCO/VICODIN) 5-325 MG per tablet 1-2 tablet  1-2 tablet Oral Q6H PRN Vianne Bulls, MD      . menthol-cetylpyridinium (CEPACOL) lozenge 3 mg  1 lozenge Oral PRN Mcarthur Rossetti, MD       Or  . phenol (CHLORASEPTIC) mouth spray 1 spray  1 spray Mouth/Throat PRN Mcarthur Rossetti, MD      . methocarbamol (ROBAXIN) tablet 500 mg  500 mg Oral Q6H PRN Mcarthur Rossetti, MD       Or  . methocarbamol (ROBAXIN) 500 mg in dextrose 5 % 50 mL IVPB  500 mg Intravenous Q6H PRN Mcarthur Rossetti, MD      .  metoCLOPramide (REGLAN) tablet 5-10 mg  5-10 mg Oral Q8H PRN Mcarthur Rossetti, MD       Or  . metoCLOPramide (REGLAN) injection 5-10 mg  5-10 mg Intravenous Q8H PRN Mcarthur Rossetti, MD      . morphine 2 MG/ML injection 0.5 mg  0.5 mg Intravenous Q2H PRN Mcarthur Rossetti, MD      . ondansetron [facility]) tablet 4 mg  4 mg Oral Q6H PRN Mcarthur Rossetti, MD       Or  . ondansetron [facility]) injection 4 mg  4 mg Intravenous Q6H PRN Mcarthur Rossetti, MD      . polyethylene glycol (MIRALAX / GLYCOLAX) packet 17 g  17 g Oral Daily PRN Vianne Bulls, MD      . Felipe Drone THICKENUP CLEAR   Oral PRN Reyne Dumas, MD         Discharge Medications: Please see discharge summary for a list of discharge medications.  Relevant Imaging Results:  Relevant Lab Results:   Additional Information SSN: [place], [place]

## 2015-09-24 NOTE — Op Note (Signed)
NAME:  Kevin Hendrix, Kevin Hendrix NO.:  [id number]  MEDICAL RECORD NO.:  [id number]  LOCATION:                                 FACILITY:  PHYSICIAN:  Lind Guest. Ninfa Linden, M.D.DATE OF BIRTH:  01-11-1929  DATE OF PROCEDURE:  09/23/2015 DATE OF DISCHARGE:                              OPERATIVE REPORT   PREOPERATIVE DIAGNOSIS:  Left hip displaced high intertrochanteric/low basicervical hip fracture.  POSTOPERATIVE DIAGNOSIS:  Left hip displaced high intertrochanteric/low basicervical hip fracture.  PROCEDURE:  Open reduction and internal fixation of left hip fracture using Smith and Nephew medullary INTERTAN nail 10 x 40 with 90/85 dual lag screw combination.  SURGEON:  Lind Guest. Ninfa Linden, MD  ASSISTANT:  Erskine Emery, PA-C  ANESTHESIA:  General.  ANTIBIOTICS:  2 g IV Ancef.  BLOOD LOSS:  Less than 100 mL.  COMPLICATIONS:  None.  INDICATIONS:  Kevin Hendrix is an 82 year old with Parkinson disease and other medical problems who has had a series of falls being unsteady on his feet.  He fell again yesterday evening, and was transported to [facility] Emergency Department.  He was found to have a displaced left hip fracture.  This has appeared to be a high intertrochanteric type of fracture and we recommended open reduction and internal fixation with an intramedullary rod and hip screw combination. He has had this happen to him in 2014, where I did some something similar to his right hip.  His family is at the bedside.  We spoke in detail about this surgery.  This was last evening and then we had him transferred to [facility] since this is where I was operating all day today.  The risks and benefits of surgery have been explained thoroughly including the perioperative risk of death even given his medical status.  They understand our goals were decreased pain, improved mobility, and overall improved quality of life and they do wish for  [place] to proceed.  PROCEDURE DESCRIPTION:  After informed consent was obtained, appropriate left hip was marked.  He was brought to the operating room.  General anesthesia was obtained while he was on a stretcher.  He was then fashioned supine on the fracture table with the perineal post in place. His left operative leg in in-line skeletal traction and the right hip in abduction stirrup, appropriate padding in the popliteal area.  We then assessed this fracture under direct fluoroscopy and with traction, we were able to reduce the fracture.  We then chose our nail keeping it sterilely in the box choosing a Smith and Nephew 10 x 40 length left INTERTAN nail holding it up to the femur under fluoroscopy.  We passed this off to the back table and then opened it up sterilely.  We then prepped his left hip with DuraPrep and sterile drapes.  A time-out was called and he was identified as correct patient, correct left hip.  We then made an incision proximal to the greater trochanter and dissected down the tip of the greater trochanter.  We then placed a temporary guide pin from the tip of the greater trochanter down the lesser trochanter.  This was all done under direct fluoroscopy.  We then used  an initiating reamer up in the femoral canal and then we were able to easily pass our femoral nail down the canal the way to the knee.  Using the outrigger guide, we made a separate incision in the lateral thigh and then placed temporary guide pins and took measurements off this for a 90/85 lag screw.  We drilled to the appropriate depth of both of the screws and placed the screws without difficulty letting off the traction and placing a compression component.  We then removed the outrigger guide.  We irrigated both wounds with normal saline solution, closing the deep tissue with 0 Vicryl followed by 2-0 Vicryl in subcutaneous tissue, and interrupted staples on the skin.  Well-padded sterile dressing was  applied.  He was taken off the fracture table, awakened, extubated, and taken to recovery room in stable condition.  Of note, Erskine Emery, PA-C assisted in the entire case.  His assistance was crucial for facilitating all aspects of this case.     Lind Guest. Ninfa Linden, M.D.   ______________________________ Lind Guest. Ninfa Linden, M.D.    CYB/MEDQ  D:  09/23/2015  T:  09/24/2015  Job:  [id number]

## 2015-09-24 NOTE — Progress Notes (Signed)
Subjective: 1 Day Post-Op Procedure(s) (LRB): INTRAMEDULLARY (IM) NAIL INTERTROCHANTRIC (Left) Patient reports pain as moderate.  Tolerated surgery well.  Objective: Vital signs in last 24 hours: Temp:  [97.7 F (36.5 C)-98.5 F (36.9 C)] 98.5 F (36.9 C) (03/15 0554) Pulse Rate:  [58-89] 65 (03/15 0554) Resp:  [12-21] 16 (03/15 0554) BP: (126-177)/(64-151) 137/70 mmHg (03/15 0554) SpO2:  [93 %-100 %] 100 % (03/15 0554)  Intake/Output from previous day: 03/14 0701 - 03/15 0700 In: 900 [I.V.:800; IV Piggyback:100] Out: 975 [Urine:900; Blood:75] Intake/Output this shift:     Recent Labs  09/22/15 0534 09/22/15 1829 09/24/15 0500  HGB 10.1* 11.8* 10.3*    Recent Labs  09/22/15 1829 09/24/15 0500  WBC 6.8 7.7  RBC 3.88* 3.32*  HCT 35.3* 31.1*  PLT 146* 110*    Recent Labs  09/22/15 1829 09/24/15 0500  NA 139 140  K 3.5 3.7  CL 101 102  CO2 28 29  BUN 23* 16  CREATININE 1.42* 1.23  GLUCOSE 110* 125*  CALCIUM 9.0 8.5*    Recent Labs  09/22/15 2140  INR 1.24    Sensation intact distally Intact pulses distally Dorsiflexion/Plantar flexion intact Incision: scant drainage  Assessment/Plan: 1 Day Post-Op Procedure(s) (LRB): INTRAMEDULLARY (IM) NAIL INTERTROCHANTRIC (Left) Up with therapy Discharge to SNF  Can WBAT with assistance left hip. Aspirin only for DVT coverage considering his significant fall risk.  Kevin Hendrix Y 09/24/2015, 7:31 AM

## 2015-09-24 NOTE — Clinical Social Work Note (Signed)
Clinical Social Work Assessment  Patient Details  Name: Kevin Hendrix MRN: [id number] Date of Birth: 12-Dec-1928  Date of referral:  09/24/15               Reason for consult:  Facility Placement                Permission sought to share information with:  Facility Sport and exercise psychologist, Family Supports Permission granted to share information::  No (Patient disoriented; Completed assessment with wife and daughter)  Name::     Kevin Hendrix  Agency::  [facility] SNFs  Relationship::  Daughter  Contact Information:  [phone]  Housing/Transportation Living arrangements for the past 2 months:  Single Family Home Source of Information:  Adult Children, Spouse Patient Interpreter Needed:  None Criminal Activity/Legal Involvement Pertinent to Current Situation/Hospitalization:  No - Comment as needed Significant Relationships:  Adult Children, Spouse Lives with:  Spouse Do you feel safe going back to the place where you live?  No Need for family participation in patient care:  Yes (Comment)  Care giving concerns:  CSW received referral for possible SNF placement at time of discharge. Patient is disoriented. CSW spoke with patient's daughter and spouse regarding PT recommendation of SNF placement at time of discharge. Patient's spouse stated that she was unsure about sending him to a nursing home and would like to think about it, but gave CSW permission to fax out referral. Patient's daughter and patient's wife expressed understanding of PT recommendation and may be agreeable to SNF placement at time of discharge. CSW to continue to follow and assist with discharge planning needs.   Social Worker assessment / plan:  CSW spoke with patient's daughter, Kevin Hendrix, and patient's wife concerning possibility of rehab at [facility] before returning home.  Employment status:  Retired Nurse, adult PT Recommendations:  [place] / Referral to  community resources:  [place]  Patient/Family's Response to care:  Patient's spouse and daughter are not sure if they want patient to go to a SNF versus home health services but will think about it and let CSW know decision.  Patient/Family's Understanding of and Emotional Response to Diagnosis, Current Treatment, and Prognosis:  Patient is realistic regarding therapy needs. No questions/concerns about plan or treatment.    Emotional Assessment Appearance:  Appears stated age Attitude/Demeanor/Rapport:  Unable to Assess (Disoriented) Affect (typically observed):  Unable to Assess (Disoriented) Orientation:  Oriented to Self, Oriented to Place Alcohol / Substance use:  Not Applicable Psych involvement (Current and /or in the community):  No (Comment)  Discharge Needs  Concerns to be addressed:  Care Coordination Readmission within the last 30 days:  No Current discharge risk:  None Barriers to Discharge:  Continued Medical Work up   [employer], [place] 09/24/2015, 11:07 AM

## 2015-09-24 NOTE — Evaluation (Signed)
Occupational Therapy Evaluation Patient Details Name: Kevin Hendrix MRN: [id number] DOB: 1929-03-07 Today's Date: 09/24/2015    History of Present Illness 82 y.o. male with h/o multiple falls, R hip fx Aug 2014 s/p ORIF, Parkinsons admitted with L hip fx, s/p IM nail 09/23/15.   Clinical Impression   Patient presenting with decreased ADL and functional mobility independence secondary to above. Patient required assistance PTA, pt reports he has an aide assist 6X per week and he was not taking showers PTA. Patient currently functioning at an overall mod to max assist level, requiring +2 for OOB mobility. Patient will benefit from acute OT to increase overall independence in the areas of ADLs, functional mobility, and overall safety in order to safely discharge to venue listed below.     Follow Up Recommendations  SNF;Supervision/Assistance - 24 hour    Equipment Recommendations  Other (comment) (TBD)    Recommendations for Other Services  None at this time    Precautions / Restrictions Precautions Precautions: Fall Precaution Comments: multiple falls per chart Restrictions Weight Bearing Restrictions: Yes LLE Weight Bearing: Weight bearing as tolerated Other Position/Activity Restrictions: WBAT LLE     Mobility Bed Mobility Overal bed mobility: Needs Assistance;+2 for physical assistance Bed Mobility: Supine to Sit     Supine to sit: +2 for physical assistance;Max assist     General bed mobility comments: assist to raise trunk and advance BLEs, pt 40%  Transfers Overall transfer level: Needs assistance Equipment used: Rolling walker (2 wheeled) Transfers: Sit to/from [facility] Sit to Stand: +2 physical assistance;Max assist Stand pivot transfers: +2 physical assistance;Min assist;+2 safety/equipment       General transfer comment: max A to rise, pt 50%, verbal/manual cues for positioning in RW and hand placement for several pivotal steps to  recliner with RW, increased time    Balance Overall balance assessment: Needs assistance;History of Falls Sitting-balance support: Bilateral upper extremity supported;Feet supported Sitting balance-Leahy Scale: Poor Sitting balance - Comments: posterior lean, requires BLE and UE support, not able to lean forward past neutral with assist for forward reach Postural control: Posterior lean Standing balance support: Bilateral upper extremity supported Standing balance-Leahy Scale: Poor Standing balance comment: relies on BUE support    ADL Overall ADL's : Needs assistance/impaired   Eating/Feeding Details (indicate cue type and reason): SLP assisted with this after OT eval Grooming: Minimal assistance;Sitting Grooming Details (indicate cue type and reason): in recliner, supported  Upper Body Bathing: Sitting;Maximal assistance   Lower Body Bathing: Maximal assistance;Sit to/from stand   Upper Body Dressing : Maximal assistance;Sitting   Lower Body Dressing: Maximal assistance;Sit to/from stand   Toilet Transfer: Moderate assistance;+2 for safety/equipment;+2 for physical assistance Toilet Transfer Details (indicate cue type and reason): simulated EOB to recliner transfer [place] and Hygiene: Maximal assistance;Sit to/from Nurse, children's Details (indicate cue type and reason): did not occur   General ADL Comments: Pt with poor sitting balance, EOB. Pt requires up to mod assist to maintain static sitting, pt with posterior lean in sitting.     Vision Additional Comments: difficult to fully assess due to decreased communication. Pt tended to undershoot when therapist asking him to reach for items. Pt pushing on therapists finger on arm rest when call bell in his lap and therapist asking him to push nurses button. Will continue to assess vision.          Pertinent Vitals/Pain Pain Assessment: Faces Faces Pain Scale: Hurts a little bit  Pain  Location: L hip Pain Descriptors / Indicators: Sore;Aching Pain Intervention(s): Monitored during session;RN gave pain meds during session;Repositioned;Ice applied;Limited activity within patient's tolerance     Hand Dominance  ("both")   Extremity/Trunk Assessment Upper Extremity Assessment Upper Extremity Assessment: Generalized weakness   Lower Extremity Assessment Lower Extremity Assessment: Defer to PT evaluation LLE Deficits / Details: hip/knee AAROM WFL, ankle at least 3/5   Cervical / Trunk Assessment Cervical / Trunk Assessment: Kyphotic   Communication Communication Communication: HOH   Cognition Arousal/Alertness: Awake/alert Behavior During Therapy: Flat affect Overall Cognitive Status: Difficult to assess              Home Living Family/patient expects to be discharged to:: Private residence Living Arrangements: Spouse/significant other Available Help at Discharge: Personal care attendant;Family   Home Access: Stairs to enter Technical brewer of Steps: 4   Home Layout: One level     Bathroom Shower/Tub:  (sponge bathes)   Bathroom Toilet:  (unsure, pt reports he uses BSC)     Home Equipment: Walker - 2 wheels;Cane - single point;Bedside commode;Wheelchair - manual   Prior Functioning/Environment Level of Independence: Needs assistance  Gait / Transfers Assistance Needed: uses RW or cane for ambulation ADL's / Homemaking Assistance Needed: aide comes 6x/week    OT Diagnosis: Generalized weakness;Acute pain   OT Problem List: Decreased strength;Decreased range of motion;Decreased activity tolerance;Impaired balance (sitting and/or standing);Decreased coordination;Decreased safety awareness;Decreased knowledge of use of DME or AE;Decreased knowledge of precautions;Pain;Impaired vision/perception   OT Treatment/Interventions: Self-care/ADL training;Therapeutic exercise;Energy conservation;DME and/or AE instruction;Visual/perceptual  remediation/compensation;Therapeutic activities;Patient/family education;Balance training    OT Goals(Current goals can be found in the care plan section) Acute Rehab OT Goals Patient Stated Goal: none stated OT Goal Formulation: With patient Time For Goal Achievement: 10/08/15 Potential to Achieve Goals: Good ADL Goals Pt Will Perform Grooming: with set-up;sitting (EOB, unsupported) Pt Will Transfer to Toilet: with min assist;stand pivot transfer;bedside commode Pt/caregiver will Perform Home Exercise Program: Increased ROM;Increased strength;Both right and left upper extremity;With minimal assist;With written HEP provided  OT Frequency: Min 2X/week   Barriers to D/C: none known at this time       Co-evaluation PT/OT/SLP Co-Evaluation/Treatment: Yes Reason for Co-Treatment: For patient/therapist safety;Complexity of the patient's impairments (multi-system involvement)   OT goals addressed during session: ADL's and self-care;Strengthening/ROM      End of Session Equipment Utilized During Treatment: Gait belt;Rolling walker Nurse Communication: Mobility status  Activity Tolerance: Patient tolerated treatment well Patient left: in chair;with call bell/phone within reach;with chair alarm set   Time: [phone] OT Time Calculation (min): 26 min Charges:  OT General Charges $OT Visit: 1 Procedure OT Evaluation $OT Eval Moderate Complexity: 1 Procedure  Chrys Racer , MS, OTR/L, CLT Pager: [phone]  09/24/2015, 10:58 AM

## 2015-09-25 ENCOUNTER — Inpatient Hospital Stay (HOSPITAL_COMMUNITY): Payer: Medicare Other

## 2015-09-25 DIAGNOSIS — I481 Persistent atrial fibrillation: Secondary | ICD-10-CM

## 2015-09-25 DIAGNOSIS — S72001A Fracture of unspecified part of neck of right femur, initial encounter for closed fracture: Secondary | ICD-10-CM

## 2015-09-25 LAB — URINE MICROSCOPIC-ADD ON: RBC / HPF: NONE SEEN RBC/hpf (ref 0–5)

## 2015-09-25 LAB — URINALYSIS, ROUTINE W REFLEX MICROSCOPIC
GLUCOSE, UA: NEGATIVE mg/dL
Hgb urine dipstick: NEGATIVE
KETONES UR: 15 mg/dL — AB
LEUKOCYTES UA: NEGATIVE
NITRITE: NEGATIVE
PH: 5.5 (ref 5.0–8.0)
PROTEIN: 100 mg/dL — AB
Specific Gravity, Urine: 1.028 (ref 1.005–1.030)

## 2015-09-25 LAB — COMPREHENSIVE METABOLIC PANEL
ALK PHOS: 43 U/L (ref 38–126)
ANION GAP: 9 (ref 5–15)
AST: 26 U/L (ref 15–41)
Albumin: 2.7 g/dL — ABNORMAL LOW (ref 3.5–5.0)
BILIRUBIN TOTAL: 0.8 mg/dL (ref 0.3–1.2)
BUN: 22 mg/dL — ABNORMAL HIGH (ref 6–20)
CALCIUM: 8.6 mg/dL — AB (ref 8.9–10.3)
CO2: 27 mmol/L (ref 22–32)
CREATININE: 1.31 mg/dL — AB (ref 0.61–1.24)
Chloride: 104 mmol/L (ref 101–111)
GFR calc non Af Amer: 48 mL/min — ABNORMAL LOW (ref >60)
GFR, EST AFRICAN AMERICAN: 56 mL/min — AB (ref >60)
Glucose, Bld: 107 mg/dL — ABNORMAL HIGH (ref 65–99)
Potassium: 3.4 mmol/L — ABNORMAL LOW (ref 3.5–5.1)
Sodium: 140 mmol/L (ref 135–145)
TOTAL PROTEIN: 6.1 g/dL — AB (ref 6.5–8.1)

## 2015-09-25 LAB — CBC
HEMATOCRIT: 28.6 % — AB (ref 39.0–52.0)
HEMOGLOBIN: 9.9 g/dL — AB (ref 13.0–17.0)
MCH: 31.7 pg (ref 26.0–34.0)
MCHC: 34.6 g/dL (ref 30.0–36.0)
MCV: 91.7 fL (ref 78.0–100.0)
Platelets: 119 10*3/uL — ABNORMAL LOW (ref 150–400)
RBC: 3.12 MIL/uL — AB (ref 4.22–5.81)
RDW: 13.6 % (ref 11.5–15.5)
WBC: 6.4 10*3/uL (ref 4.0–10.5)

## 2015-09-25 MED ORDER — METOPROLOL TARTRATE 1 MG/ML IV SOLN: 5.0000 mg | Freq: Four times a day (QID) | INTRAVENOUS | Status: DC | PRN

## 2015-09-25 MED ORDER — POTASSIUM CHLORIDE CRYS ER 20 MEQ PO TBCR
40.0000 meq | EXTENDED_RELEASE_TABLET | Freq: Once | ORAL | Status: AC
  Administered 2015-09-25: 40 meq via ORAL
  Filled 2015-09-25: qty 2

## 2015-09-25 NOTE — Progress Notes (Signed)
MBSS complete. Full report located under chart review in imaging section. Recommend: Dys 1 (puree) and nectar thick liquids.     Kevin Hendrix [place].[employer] [phone]

## 2015-09-25 NOTE — Progress Notes (Signed)
Physical Therapy Treatment Patient Details Name: Kevin Hendrix MRN: [id number] DOB: Feb 03, 1929 Today's Date: 09/25/2015    History of Present Illness 82 y.o. male with h/o multiple falls, R hip fx Aug 2014 s/p ORIF, Parkinsons admitted with L hip fx, s/p IM nail 09/23/15.    PT Comments    Patient tolerating standing longer today and, though incontinent of stool, still able to get to chair with assist of 2.  Still agree with SNF level rehab at d/c.  No c/o pain and able to do LE therex as well.  Will continue to follow.  Follow Up Recommendations  SNF;Supervision/Assistance - 24 hour     Equipment Recommendations  None recommended by PT    Recommendations for Other Services       Precautions / Restrictions Precautions Precautions: Fall Precaution Comments: multiple falls per chart Restrictions LLE Weight Bearing: Weight bearing as tolerated    Mobility  Bed Mobility Overal bed mobility: Needs Assistance;+2 for physical assistance       Supine to sit: +2 for physical assistance;Max assist     General bed mobility comments: Patient required assist to bring legs off bed and to lift trunk upright, scooted to EOB with pad under pt  Transfers Overall transfer level: Needs assistance Equipment used: Rolling walker (2 wheeled) Transfers: Sit to/from Stand Sit to Stand: +2 physical assistance;Max assist Stand pivot transfers: +2 physical assistance;Mod assist       General transfer comment: stood, but balance posterior and feet almost crossed; needed assist for balance, for bringing hands up to walker and pt incontinent of stool in standing, stood and continued to have bowel movement, then cleaned up and privotal steps with walker and assist to chair, +2 for lowering assist to chair as well  Ambulation/Gait                 Stairs            Wheelchair Mobility    Modified Rankin (Stroke Patients Only)       Balance     Sitting balance-Leahy  Scale: Poor     Standing balance support: During functional activity Standing balance-Leahy Scale: Poor Standing balance comment: Bil UE support and mod A of 2 for standing balance; needed third person to assist with hygiene; but tolerated standing about 5 minutes while obtaining supplies for cleaning and while pt finished BM                    Cognition Arousal/Alertness: Awake/alert Behavior During Therapy: Flat affect Overall Cognitive Status: No family/caregiver present to determine baseline cognitive functioning                      Exercises Total Joint Exercises Ankle Circles/Pumps: AAROM;Both;10 reps;Supine Short Arc Quad: AROM;Both;10 reps;Supine Heel Slides: AAROM;AROM;Both;10 reps;Supine    General Comments        Pertinent Vitals/Pain Faces Pain Scale: No hurt    Home Living                      Prior Function            PT Goals (current goals can now be found in the care plan section) Progress towards PT goals: Progressing toward goals    Frequency  Min 3X/week    PT Plan Current plan remains appropriate    Co-evaluation             End of Session Equipment Utilized  During Treatment: Gait belt Activity Tolerance: Patient limited by fatigue Patient left: in chair;with chair alarm set;with call bell/phone within reach     Time: 1410-1436 PT Time Calculation (min) (ACUTE ONLY): 26 min  Charges:  $Therapeutic Exercise: 8-22 mins $Therapeutic Activity: 8-22 mins                    G Codes:      Reginia Naas 2015/10/25, 3:10 PM  Magda Kiel, [place] 10-25-15

## 2015-09-25 NOTE — Progress Notes (Signed)
Patient's family is completing paperwork at [facility]. CSW will continue to follow for discharge.  Percell Locus Mliss Wedin LCSWA [phone]

## 2015-09-25 NOTE — Progress Notes (Signed)
RN offered SCD pumps to pt. Pt refused. Stated he doesn't want anything on his legs. Dorita Fray 09/25/2015

## 2015-09-25 NOTE — Clinical Documentation Improvement (Signed)
Internal Medicine  Severe Malnutrition is documented in Nutritional Consult on 3/15 as "evidenced by severe depletion of body fat, severe depletion of muscle mass"; BMI is 25.68.   Please assess Consult and render an opinion in next progress note. Do not see diagnosis on Problem List and no BPA flag initiated by Dietary. Thank you!   Other condition  Unable to clinically determine  Document any associated diagnoses/conditions  Supporting Information: :   Please exercise your independent, professional judgment when responding. A specific answer is not anticipated or expected.  Thank You, Zoila Shutter RN, BSN, [place] [phone]; Cell: [phone]

## 2015-09-25 NOTE — Progress Notes (Addendum)
[facility] PROGRESS NOTE  Kevin Hendrix [id number] DOB: Mar 20, 1929 DOA: 09/22/2015 PCP: Henrine Screws, MD  Brief Summary  The patient is an 82 year old male with history of Parkinson's disease, chronic atrial fibrillation, chronic kidney disease stage III, hypertension who presented to the emergency department after a mechanical fall resulted in left hip pain.  In the emergency department, he was found to have a comminuted proximal left femur fracture. Status post open reduction internal fixation on 3/15.  Assessment/Plan  Left proximal femur fracture after mechanical fall. Although from a cardiovascular standpoint patient is moderate risk for complication, due to his progressive Parkinson's disease and frailty, his risk of perioperative complication is considerably higher. He is at risk for delirium, dysphagia, aspiration, ileus.  He spends more than 90% of his day in a chair or in bed and ambulates only a limited distance. We discussed the possibility of no surgery, however the patient and family decided to proceed with surgery. Status post-ORIF -  Continue pain management -  Bowel regimen -   DVT prophylaxis per orthopedic surgery , currently on heparin  Low-grade fevers, tachycardia Patient deemed to be moderate risk for aspiration Chest x-ray to rule out pneumonia, UA Start incentive spirometry  Dysphagia Patient placed on dysphagia 2 diet with honey thick liquids, Chest x-ray to rule out aspiration Speech therapy to make final recommendations   Parkinson's disease, stable, but patient has considerable slowing, gait instability at baseline   resume Sinemet postoperatively as quickly as possible   speech therapy evaluation post surgery   Chronic atrial fibrillation,  -  CHADS-VASc 4, not on AC d/t recurrent falls  Tachycardic this morning, obtain EKG, IV metoprolol for rate control  CKD stage III, baseline creatinine of about 1.2 -  Hold Lasix, minimize  nephrotoxins, improved   Essential hypertension, blood pressures initially very elevated secondary to pain - Continue home-dose Norvasc 5 mg qD  - Treat SBP > 170, or DBP >90 with hydralazine 10 mg IVP  ABLA  - Hgb 11.8>10.3  > 9.9 - Suspected secondary to CKD  No signs of acute blood loss, follow CBC     Code Status: full Family Communication: patient, his wife, daughter Disposition Plan:   .  Anticipate discharge to SNF  in the next 1-2 days pending further workup   Consultants:  Orthopedic surgery, Dr. Ninfa Linden  Procedures:  Hip x-ray  Chest x-ray  Antibiotics:  Perioperative   HPI/Subjective:  Low-grade fever, likely postop,  Objective: Filed Vitals:   09/24/15 1331 09/24/15 2210 09/25/15 0556 09/25/15 0820  BP: 124/69 124/40 159/65 127/63  Pulse: 68 53 119 74  Temp: 98.4 F (36.9 C) 99.1 F (37.3 C) 99 F (37.2 C)   TempSrc: Oral Oral Oral   Resp: 18 16 19    Height:      Weight:   64.456 kg (142 lb 1.6 oz)   SpO2: 100% 95% 93%     Intake/Output Summary (Last 24 hours) at 09/25/15 1111 Last data filed at 09/24/15 1446  Gross per 24 hour  Intake      0 ml  Output    300 ml  Net   -300 ml   Filed Weights   09/22/15 1824 09/25/15 0556  Weight: 72.122 kg (159 lb) 64.456 kg (142 lb 1.6 oz)   Body mass index is 22.95 kg/(m^2).  Exam:   General:  Frail appearing adult male, decreased facial expression from Parkinson's, slow to answer and rigid, No acute distress  HEENT:  NCAT, MMM  Cardiovascular: I RRR, nl S1, S2 no mrg, 2+ pulses, warm extremities  Respiratory:  CTAB, no increased WOB  Abdomen:   NABS, soft, NT/ND  MSK:   Increased tone and decreased bulk, 2+ pulses, less than 2 second capillary refill, able to wiggle toes and ankle of the left extremity. Tender to palpation over the left lateral hip.  Neuro: Cogwheel rigidity, no focal deficits, left leg limited by pain  Data Reviewed: Basic Metabolic Panel:  Recent Labs Lab  09/22/15 0534 09/22/15 1829 09/24/15 0500 09/25/15 0658  NA 140 139 140 140  K 3.3* 3.5 3.7 3.4*  CL 104 101 102 104  CO2 29 28 29 27   GLUCOSE 96 110* 125* 107*  BUN 19 23* 16 22*  CREATININE 1.30* 1.42* 1.23 1.31*  CALCIUM 8.5* 9.0 8.5* 8.6*   Liver Function Tests:  Recent Labs Lab 09/22/15 1829 09/25/15 0658  AST 21 26  ALT 7* <5*  ALKPHOS 54 43  BILITOT 0.7 0.8  PROT 7.4 6.1*  ALBUMIN 3.9 2.7*   No results for input(s): LIPASE, AMYLASE in the last 168 hours. No results for input(s): AMMONIA in the last 168 hours. CBC:  Recent Labs Lab 09/22/15 0534 09/22/15 1829 09/24/15 0500 09/25/15 0658  WBC 5.2 6.8 7.7 6.4  NEUTROABS  --  5.7  --   --   HGB 10.1* 11.8* 10.3* 9.9*  HCT 30.5* 35.3* 31.1* 28.6*  MCV 95.0 91.0 93.7 91.7  PLT 123* 146* 110* 119*    Recent Results (from the past 240 hour(s))  Blood culture (routine x 2)     Status: None (Preliminary result)   Collection Time: 09/22/15  6:20 PM  Result Value Ref Range Status   Specimen Description BLOOD RIGHT ARM  Final   Special Requests BOTTLES DRAWN AEROBIC AND ANAEROBIC 5CC  Final   Culture   Final    NO GROWTH 3 DAYS Performed at [facility]    Report Status PENDING  Incomplete  Blood culture (routine x 2)     Status: None (Preliminary result)   Collection Time: 09/22/15  6:30 PM  Result Value Ref Range Status   Specimen Description BLOOD LEFT ARM  Final   Special Requests BOTTLES DRAWN AEROBIC AND ANAEROBIC 5CC  Final   Culture   Final    NO GROWTH 3 DAYS Performed at [facility]    Report Status PENDING  Incomplete     Studies: Dg C-arm 1-60 Min  09/23/2015  CLINICAL DATA:  Fixation of intertrochanteric left femur fracture. EXAM: DG C-ARM 61-120 MIN; LEFT FEMUR 2 VIEWS fluoroscopic time 36 seconds COMPARISON:  September 22, 2015 FINDINGS: The fluoroscopic images demonstrate interval fixation of the left intertrochanteric fracture with a femoral rod fixated by screws in the  proximal femur. There is no malalignment. IMPRESSION: Interval fixation of left intertrochanteric fracture without malalignment. Electronically Signed   By: Abelardo Diesel M.D.   On: 09/23/2015 16:50   Dg Femur Min 2 Views Left  09/23/2015  CLINICAL DATA:  Fixation of intertrochanteric left femur fracture. EXAM: DG C-ARM 61-120 MIN; LEFT FEMUR 2 VIEWS fluoroscopic time 36 seconds COMPARISON:  September 22, 2015 FINDINGS: The fluoroscopic images demonstrate interval fixation of the left intertrochanteric fracture with a femoral rod fixated by screws in the proximal femur. There is no malalignment. IMPRESSION: Interval fixation of left intertrochanteric fracture without malalignment. Electronically Signed   By: Abelardo Diesel M.D.   On: 09/23/2015 16:50    Scheduled Meds: . amLODipine  5 mg Oral Daily  . aspirin EC  325 mg Oral Q breakfast  . carbidopa-levodopa  1 tablet Oral QID  . finasteride  5 mg Oral Daily  . heparin  5,000 Units Subcutaneous 3 times per day   Continuous Infusions:   Principal Problem:   Hip fracture (HCC) Active Problems:   Parkinson's disease (HCC)   HTN (hypertension)   Anemia   Atrial fibrillation (HCC)   CKD (chronic kidney disease) stage 3, GFR 30-59 ml/min   Closed left hip fracture ([place])    Time spent: 30 min    [facility] Pager [phone]. If 7PM-7AM, please contact night-coverage at [email or web address], password [facility] 09/25/2015, 11:11 AM  LOS: 3 days

## 2015-09-25 NOTE — Care Management Important Message (Signed)
Important Message  Patient Details  Name: Kevin Hendrix MRN: [id number] Date of Birth: 01-02-1929   Medicare Important Message Given:  Yes    [name] [name] 09/25/2015, 3:22 PM

## 2015-09-26 DIAGNOSIS — S72002A Fracture of unspecified part of neck of left femur, initial encounter for closed fracture: Secondary | ICD-10-CM

## 2015-09-26 DIAGNOSIS — N183 Chronic kidney disease, stage 3 (moderate): Secondary | ICD-10-CM

## 2015-09-26 DIAGNOSIS — D508 Other iron deficiency anemias: Secondary | ICD-10-CM

## 2015-09-26 LAB — COMPREHENSIVE METABOLIC PANEL
ALBUMIN: 2.4 g/dL — AB (ref 3.5–5.0)
ALT: <5 U/L — ABNORMAL LOW (ref 17–63)
ANION GAP: 7 (ref 5–15)
AST: 19 U/L (ref 15–41)
Alkaline Phosphatase: 36 U/L — ABNORMAL LOW (ref 38–126)
BUN: 18 mg/dL (ref 6–20)
CHLORIDE: 106 mmol/L (ref 101–111)
CO2: 29 mmol/L (ref 22–32)
Calcium: 8.5 mg/dL — ABNORMAL LOW (ref 8.9–10.3)
Creatinine, Ser: 1.2 mg/dL (ref 0.61–1.24)
GFR calc Af Amer: >60 mL/min (ref >60)
GFR calc non Af Amer: 54 mL/min — ABNORMAL LOW (ref >60)
GLUCOSE: 103 mg/dL — AB (ref 65–99)
POTASSIUM: 3.5 mmol/L (ref 3.5–5.1)
SODIUM: 142 mmol/L (ref 135–145)
TOTAL PROTEIN: 5.6 g/dL — AB (ref 6.5–8.1)
Total Bilirubin: 0.7 mg/dL (ref 0.3–1.2)

## 2015-09-26 LAB — CBC
HCT: 25.3 % — ABNORMAL LOW (ref 39.0–52.0)
Hemoglobin: 8.3 g/dL — ABNORMAL LOW (ref 13.0–17.0)
MCH: 30.1 pg (ref 26.0–34.0)
MCHC: 32.8 g/dL (ref 30.0–36.0)
MCV: 91.7 fL (ref 78.0–100.0)
PLATELETS: 114 10*3/uL — AB (ref 150–400)
RBC: 2.76 MIL/uL — ABNORMAL LOW (ref 4.22–5.81)
RDW: 13.7 % (ref 11.5–15.5)
WBC: 3.8 10*3/uL — ABNORMAL LOW (ref 4.0–10.5)

## 2015-09-26 MED ORDER — POLYETHYLENE GLYCOL 3350 17 G PO PACK: 17.0000 g | PACK | Freq: Every day | ORAL | Status: AC | PRN

## 2015-09-26 MED ORDER — ASPIRIN 325 MG PO TBEC: 325.0000 mg | DELAYED_RELEASE_TABLET | Freq: Every day | ORAL | Status: AC

## 2015-09-26 MED ORDER — METHOCARBAMOL 500 MG PO TABS: 500.0000 mg | ORAL_TABLET | Freq: Four times a day (QID) | ORAL | Status: AC | PRN

## 2015-09-26 MED ORDER — POTASSIUM CHLORIDE CRYS ER 20 MEQ PO TBCR
40.0000 meq | EXTENDED_RELEASE_TABLET | Freq: Once | ORAL | Status: AC
  Administered 2015-09-26: 40 meq via ORAL
  Filled 2015-09-26: qty 2

## 2015-09-26 MED ORDER — HYDROCODONE-ACETAMINOPHEN 5-325 MG PO TABS: 1.0000 | ORAL_TABLET | Freq: Four times a day (QID) | ORAL | Status: AC | PRN

## 2015-09-26 MED ORDER — RESOURCE THICKENUP CLEAR PO POWD: ORAL | Status: AC

## 2015-09-26 NOTE — Progress Notes (Signed)
Nsg Discharge Note  Admit Date:  09/22/2015 Discharge date: 09/26/2015   Kevin Hendrix to be D/C'd skill nursing facility per MD order.  Report called to SNF to [facility]. No questions or concerns voiced when asked.    Discharge Medication:   Medication List    STOP taking these medications        aspirin 81 MG tablet  Replaced by:  aspirin 325 MG EC tablet      TAKE these medications        amLODipine 5 MG tablet  Commonly known as:  NORVASC  Take 5 mg by mouth daily.     aspirin 325 MG EC tablet  Take 1 tablet (325 mg total) by mouth daily with breakfast.     carbidopa-levodopa 25-100 MG tablet  Commonly known as:  SINEMET IR  Take 1 tablet by mouth 4 (four) times daily.     finasteride 5 MG tablet  Commonly known as:  PROSCAR  Take 5 mg by mouth daily.     furosemide 40 MG tablet  Commonly known as:  LASIX  Take 0.5 tablets (20 mg total) by mouth daily.     HYDROcodone-acetaminophen 5-325 MG tablet  Commonly known as:  NORCO/VICODIN  Take 1-2 tablets by mouth every 6 (six) hours as needed for moderate pain.     methocarbamol 500 MG tablet  Commonly known as:  ROBAXIN  Take 1 tablet (500 mg total) by mouth every 6 (six) hours as needed for muscle spasms.     polyethylene glycol packet  Commonly known as:  MIRALAX / GLYCOLAX  Take 17 g by mouth daily as needed for mild constipation.     RESOURCE THICKENUP CLEAR Powd  With all meals        Discharge Assessment: Filed Vitals:   09/26/15 0900 09/26/15 1347  BP: 143/76 135/74  Pulse: 88 41  Temp: 98.4 F (36.9 C) 98.4 F (36.9 C)  Resp: 20 21   Skin clean, dry and intact without evidence of skin break down, no evidence of skin tears noted. IV catheter discontinued with catheter tip intact. Site without signs and symptoms of complications - no redness or edema noted at insertion site, patient denies c/o pain - only slight tenderness at site.  Dressing with slight pressure applied.  RN reported to  [employer] [facility] discharge unit. Kevin Hendrix and her CNA tech came up to [place] to take pt. Family members accompanied also accompanied pt to [place], RN 09/26/2015 1:55 PM

## 2015-09-26 NOTE — Discharge Summary (Signed)
Physician Discharge Summary  Kevin Hendrix MRN: [id number] DOB/AGE: 80-Jan-1930 80 y.o.  PCP: Henrine Screws, MD   Admit date: 09/22/2015 Discharge date: 09/26/2015  Discharge Diagnoses:     Principal Problem:   Hip fracture [facility]) Active Problems:   Parkinson's disease ([place])   HTN (hypertension)   Anemia   Atrial fibrillation (HCC)   CKD (chronic kidney disease) stage 3, GFR 30-59 ml/min   Closed left hip fracture (HCC)    Follow-up recommendations Follow-up with PCP in 3-5 days , including all  additional recommended appointments as below Follow-up CBC, CMP in 3-5 days Continue incentive spirometry Patient therapy recommends Dys 1 (puree) and nectar thick liquids.      Medication List    STOP taking these medications        aspirin 81 MG tablet  Replaced by:  aspirin 325 MG EC tablet      TAKE these medications        amLODipine 5 MG tablet  Commonly known as:  NORVASC  Take 5 mg by mouth daily.     aspirin 325 MG EC tablet  Take 1 tablet (325 mg total) by mouth daily with breakfast.     carbidopa-levodopa 25-100 MG tablet  Commonly known as:  SINEMET IR  Take 1 tablet by mouth 4 (four) times daily.     finasteride 5 MG tablet  Commonly known as:  PROSCAR  Take 5 mg by mouth daily.     furosemide 40 MG tablet  Commonly known as:  LASIX  Take 0.5 tablets (20 mg total) by mouth daily.     HYDROcodone-acetaminophen 5-325 MG tablet  Commonly known as:  NORCO/VICODIN  Take 1-2 tablets by mouth every 6 (six) hours as needed for moderate pain.     methocarbamol 500 MG tablet  Commonly known as:  ROBAXIN  Take 1 tablet (500 mg total) by mouth every 6 (six) hours as needed for muscle spasms.     polyethylene glycol packet  Commonly known as:  MIRALAX / GLYCOLAX  Take 17 g by mouth daily as needed for mild constipation.     RESOURCE THICKENUP CLEAR Powd  With all meals         Discharge Condition: Stable   Discharge Instructions Get  Medicines reviewed and adjusted: Please take all your medications with you for your next visit with your Primary MD  Please request your Primary MD to go over all hospital tests and procedure/radiological results at the follow up, please ask your Primary MD to get all Hospital records sent to his/her office.  If you experience worsening of your admission symptoms, develop shortness of breath, life threatening emergency, suicidal or homicidal thoughts you must seek medical attention immediately by calling 911 or calling your MD immediately if symptoms less severe.  You must read complete instructions/literature along with all the possible adverse reactions/side effects for all the Medicines you take and that have been prescribed to you. Take any new Medicines after you have completely understood and accpet all the possible adverse reactions/side effects.   Do not drive when taking Pain medications.   Do not take more than prescribed Pain, Sleep and Anxiety Medications  Special Instructions: If you have smoked or chewed Tobacco in the last 2 yrs please stop smoking, stop any regular Alcohol and or any Recreational drug use.  Wear Seat belts while driving.  Please note  You were cared for by a hospitalist during your hospital stay. Once you are discharged, your primary  care physician will handle any further medical issues. Please note that NO REFILLS for any discharge medications will be authorized once you are discharged, as it is imperative that you return to your primary care physician (or establish a relationship with a primary care physician if you do not have one) for your aftercare needs so that they can reassess your need for medications and monitor your lab values.      Discharge Instructions    Diet - low sodium heart healthy    Complete by:  As directed      Increase activity slowly    Complete by:  As directed            No Known Allergies    Disposition: 01-Home or  Self Care   Consults: Orthopedics     Significant Diagnostic Studies:  Dg Chest 1 View  09/22/2015  CLINICAL DATA:  Fall at home today, left hip pain. EXAM: CHEST 1 VIEW COMPARISON:  Chest x-rays dated 03/07/2013 and 09/16/2010. FINDINGS: Study is hypoinspiratory with crowding of the perihilar and bibasilar bronchovascular markings. Given the low lung volumes, lungs appear clear. No evidence of pneumonia. No pleural effusions seen. No pneumothorax seen. Heart size likely accentuated by the low lung volumes. Cardiomediastinal silhouette similar to the appearance on chest x-ray of 09/16/2010. Degenerative change again noted within the slightly scoliotic thoracic spine. Degenerative changes again noted at each shoulder. Old healed rib fractures noted on the right. No acute -appearing osseous abnormality. IMPRESSION: 1. Hypoinspiratory exam. Given the low lung volumes, lungs appear clear. 2. Mediastinum appears widened, but this is likely accentuated by the low lung volumes. Appearance of the cardiomediastinal silhouette is similar to chest x-ray of 09/16/2010. Recommend repeat chest x-ray with better inspiratory effort to exclude a significant widening of the mediastinum. Electronically Signed   By: Franki Cabot M.D.   On: 09/22/2015 18:05   Dg Chest 2 View  09/25/2015  CLINICAL DATA:  Shortness of breath EXAM: CHEST  2 VIEW COMPARISON:  September 22, 2015 FINDINGS: The study is limited due to prominent skin folds over the right chest and over penetration. There appear to be lung markings on both sides of the prominent skin fold and there is no convincing evidence of pneumothorax. The cardiomediastinal silhouette is stable. No other changes. IMPRESSION: 1. Limited study due to over penetration and prominent right-sided skin folds. No convincing evidence of pneumothorax. Repeat imaging with better technique could further evaluate. 2. Small effusion seen on the lateral view. Electronically Signed   By: Dorise Bullion III M.D   On: 09/25/2015 12:55   Chest Portable 1 View  09/22/2015  CLINICAL DATA:  Mediastinal widening EXAM: PORTABLE CHEST 1 VIEW COMPARISON:  1745 hours FINDINGS: The thorax is rotated to the right severely limiting evaluation of the mediastinum. Normal heart size. Lungs are clear. Right-sided rib deformities are stable. No pneumothorax. No pleural effusion. IMPRESSION: Limited examination. Lungs are clear. Incomplete evaluation of the mediastinum. Electronically Signed   By: Marybelle Killings M.D.   On: 09/22/2015 21:43   Dg Swallowing Func-speech Pathology  09/25/2015  Objective Swallowing Evaluation: Type of Study: MBS-Modified Barium Swallow Study Patient Details Name: Kevin Hendrix MRN: [id number] Date of Birth: 1928/11/25 Today's Date: 09/25/2015 Time: SLP Start Time (ACUTE ONLY): 1002-SLP Stop Time (ACUTE ONLY): 1023 SLP Time Calculation (min) (ACUTE ONLY): 21 min Past Medical History: Past Medical History Diagnosis Date . Hypertension  . Parkinson disease ([place])  . Recurrent falls 03/01/2013 . Parkinson's disease ([place])  03/01/2013 Past Surgical History: Past Surgical History Procedure Laterality Date . Inguinal hernia repair   . Appendectomy   . Tonsillectomy   . Intramedullary (im) nail intertrochanteric Right 03/08/2013   Procedure: INTRAMEDULLARY (IM) NAIL INTERTROCHANTRIC;  Surgeon: Mcarthur Rossetti, MD;  Location: [place];  Service: Orthopedics;  Laterality: Right; . Intramedullary (im) nail intertrochanteric Left 09/23/2015   Procedure: INTRAMEDULLARY (IM) NAIL INTERTROCHANTRIC;  Surgeon: Mcarthur Rossetti, MD;  Location: [place];  Service: Orthopedics;  Laterality: Left; HPI: 82 y.o. male with h/o Parkinson's disease, chronic atrial fibrillation, CKD stage III and HTN, who presented to ED with L hip pain following ground-level mechanical fall at home. CXR 3/13 limited examination. Lungs clear. MBS completed 09/2010. Results not available.  No Data Recorded Assessment / Plan /  Recommendation CHL IP CLINICAL IMPRESSIONS 09/25/2015 Therapy Diagnosis Mild pharyngeal phase dysphagia;Moderate pharyngeal phase dysphagia;Mild oral phase dysphagia;Moderate oral phase dysphagia Clinical Impression Pt exhibited mild-moderate oral and pharyngeal phase dysphagia characterized by motor deficits. Weak lingual manipulation and Parkinsonian tremors noted during intake, culminating in oral holding and lingual residue. Reduced laryngeal elevation led to decreased  airway protection with aspiration during the swallow. Reflexive cough ineffective in clearing aspiratesl. Delayed swallow initiation to the pyriform sinuses x1 observed during consecutive sips. Pt educated re: compensatory strategies (small sips/bites, slow rate, minimize environmental distractions), full assist/supervision and diet recommendation of nectar thick liquids, Dysphagia 1  textures (puree- states upper denture is not always donned when eating) and meds crushed in puree. SLP will f/u to determine diet tolerance and advise diet advancement. Impact on safety and function Moderate aspiration risk   CHL IP TREATMENT RECOMMENDATION 09/25/2015 Treatment Recommendations Therapy as outlined in treatment plan below   Prognosis 09/25/2015 Prognosis for Safe Diet Advancement Fair Barriers to Reach Goals Cognitive deficits;Severity of deficits Barriers/Prognosis Comment -- CHL IP DIET RECOMMENDATION 09/25/2015 SLP Diet Recommendations Dysphagia 1 (Puree) solids;Nectar thick liquid Liquid Administration via Cup;No straw Medication Administration Crushed with puree Compensations Minimize environmental distractions;Slow rate;Small sips/bites Postural Changes Seated upright at 90 degrees   CHL IP OTHER RECOMMENDATIONS 09/25/2015 Recommended Consults -- Oral Care Recommendations Oral care BID Other Recommendations Order thickener from pharmacy;Remove water pitcher;Prohibited food (jello, ice cream, thin soups)   CHL IP FOLLOW UP RECOMMENDATIONS 09/25/2015  Follow up Recommendations Home health SLP   CHL IP FREQUENCY AND DURATION 09/25/2015 Speech Therapy Frequency (ACUTE ONLY) min 2x/week Treatment Duration 2 weeks      CHL IP ORAL PHASE 09/25/2015 Oral Phase Impaired Oral - Pudding Teaspoon -- Oral - Pudding Cup -- Oral - Honey Teaspoon -- Oral - Honey Cup -- Oral - Nectar Teaspoon -- Oral - Nectar Cup Lingual/palatal residue;Delayed oral transit;Weak lingual manipulation;Holding of bolus Oral - Nectar Straw -- Oral - Thin Teaspoon -- Oral - Thin Cup Lingual/palatal residue;Delayed oral transit Oral - Thin Straw -- Oral - Puree -- Oral - Mech Soft -- Oral - Regular -- Oral - Multi-Consistency -- Oral - Pill -- Oral Phase - Comment --  CHL IP PHARYNGEAL PHASE 09/25/2015 Pharyngeal Phase Impaired Pharyngeal- Pudding Teaspoon -- Pharyngeal -- Pharyngeal- Pudding Cup -- Pharyngeal -- Pharyngeal- Honey Teaspoon -- Pharyngeal -- Pharyngeal- Honey Cup -- Pharyngeal -- Pharyngeal- Nectar Teaspoon -- Pharyngeal -- Pharyngeal- Nectar Cup Penetration/Aspiration during swallow;Reduced laryngeal elevation;Reduced airway/laryngeal closure;Reduced epiglottic inversion;Delayed swallow initiation-pyriform sinuses Pharyngeal Material enters airway, remains ABOVE vocal cords then ejected out Pharyngeal- Nectar Straw -- Pharyngeal -- Pharyngeal- Thin Teaspoon -- Pharyngeal -- Pharyngeal- Thin Cup Penetration/Aspiration during swallow;Reduced laryngeal elevation;Reduced airway/laryngeal  closure;Reduced tongue base retraction;Reduced epiglottic inversion;Moderate aspiration Pharyngeal Material enters airway, passes BELOW cords and not ejected out despite cough attempt by patient Pharyngeal- Thin Straw -- Pharyngeal -- Pharyngeal- Puree -- Pharyngeal -- Pharyngeal- Mechanical Soft -- Pharyngeal -- Pharyngeal- Regular -- Pharyngeal -- Pharyngeal- Multi-consistency -- Pharyngeal -- Pharyngeal- Pill -- Pharyngeal -- Pharyngeal Comment --  CHL IP CERVICAL ESOPHAGEAL PHASE 09/25/2015 Cervical  Esophageal Phase WFL Pudding Teaspoon -- Pudding Cup -- Honey Teaspoon -- Honey Cup -- Nectar Teaspoon -- Nectar Cup -- Nectar Straw -- Thin Teaspoon -- Thin Cup -- Thin Straw -- Puree -- Mechanical Soft -- Regular -- Multi-consistency -- Pill -- Cervical Esophageal Comment -- No flowsheet data found. [place] Siren 09/25/2015, 12:22 PM  Orbie Pyo Colvin Caroli.Ed CCC-SLP Pager [phone]             Dg C-arm 1-60 Min  09/23/2015  CLINICAL DATA:  Fixation of intertrochanteric left femur fracture. EXAM: DG C-ARM 61-120 MIN; LEFT FEMUR 2 VIEWS fluoroscopic time 36 seconds COMPARISON:  September 22, 2015 FINDINGS: The fluoroscopic images demonstrate interval fixation of the left intertrochanteric fracture with a femoral rod fixated by screws in the proximal femur. There is no malalignment. IMPRESSION: Interval fixation of left intertrochanteric fracture without malalignment. Electronically Signed   By: Abelardo Diesel M.D.   On: 09/23/2015 16:50   Dg Hip Unilat With Pelvis 2-3 Views Left  09/22/2015  CLINICAL DATA:  82 year old male who fell at home today. Left hip pain. Initial encounter. EXAM: DG HIP (WITH OR WITHOUT PELVIS) 2-3V LEFT COMPARISON:  None. FINDINGS: Impacted and comminuted basicervical versus intertrochanteric fracture with varus angulation. The left femoral head remains normally located. Chronic postoperative changes in the proximal right femur with partially visible right femoral intramedullary rod. Proximal interlocking dynamic hip screw. Bulky dystrophic ossification in the proximal right femur. Stool ball in the rectum. Osteopenia at the pelvis. No acute pelvic fracture identified. IMPRESSION: Comminuted left femur basicervical versus intertrochanteric fracture with varus impaction. Electronically Signed   By: Genevie Ann M.D.   On: 09/22/2015 18:03   Dg Femur Min 2 Views Left  09/23/2015  CLINICAL DATA:  Fixation of intertrochanteric left femur fracture. EXAM: DG C-ARM 61-120 MIN; LEFT FEMUR 2  VIEWS fluoroscopic time 36 seconds COMPARISON:  September 22, 2015 FINDINGS: The fluoroscopic images demonstrate interval fixation of the left intertrochanteric fracture with a femoral rod fixated by screws in the proximal femur. There is no malalignment. IMPRESSION: Interval fixation of left intertrochanteric fracture without malalignment. Electronically Signed   By: Abelardo Diesel M.D.   On: 09/23/2015 16:50        Filed Weights   09/22/15 1824 09/25/15 0556 09/26/15 0700  Weight: 72.122 kg (159 lb) 64.456 kg (142 lb 1.6 oz) 61.372 kg (135 lb 4.8 oz)     Microbiology: Recent Results (from the past 240 hour(s))  Blood culture (routine x 2)     Status: None (Preliminary result)   Collection Time: 09/22/15  6:20 PM  Result Value Ref Range Status   Specimen Description BLOOD RIGHT ARM  Final   Special Requests BOTTLES DRAWN AEROBIC AND ANAEROBIC 5CC  Final   Culture   Final    NO GROWTH 3 DAYS Performed at [facility]    Report Status PENDING  Incomplete  Blood culture (routine x 2)     Status: None (Preliminary result)   Collection Time: 09/22/15  6:30 PM  Result Value Ref Range Status   Specimen Description BLOOD LEFT ARM  Final   Special Requests BOTTLES DRAWN AEROBIC AND ANAEROBIC 5CC  Final   Culture   Final    NO GROWTH 3 DAYS Performed at [facility]    Report Status PENDING  Incomplete       Blood Culture    Component Value Date/Time   SDES BLOOD LEFT ARM 09/22/2015 1830   SPECREQUEST BOTTLES DRAWN AEROBIC AND ANAEROBIC 5CC 09/22/2015 1830   CULT  09/22/2015 1830    NO GROWTH 3 DAYS Performed at [facility] PENDING 09/22/2015 1830      Labs: Results for orders placed or performed during the hospital encounter of 09/22/15 (from the past 48 hour(s))  CBC     Status: Abnormal   Collection Time: 09/25/15  6:58 AM  Result Value Ref Range   WBC 6.4 4.0 - 10.5 K/uL   RBC 3.12 (L) 4.22 - 5.81 MIL/uL   Hemoglobin 9.9 (L) 13.0 -  17.0 g/dL   HCT 28.6 (L) 39.0 - 52.0 %   MCV 91.7 78.0 - 100.0 fL   MCH 31.7 26.0 - 34.0 pg   MCHC 34.6 30.0 - 36.0 g/dL   RDW 13.6 11.5 - 15.5 %   Platelets 119 (L) 150 - 400 K/uL    Comment: CONSISTENT WITH PREVIOUS RESULT  Comprehensive metabolic panel     Status: Abnormal   Collection Time: 09/25/15  6:58 AM  Result Value Ref Range   Sodium 140 135 - 145 mmol/L   Potassium 3.4 (L) 3.5 - 5.1 mmol/L   Chloride 104 101 - 111 mmol/L   CO2 27 22 - 32 mmol/L   Glucose, Bld 107 (H) 65 - 99 mg/dL   BUN 22 (H) 6 - 20 mg/dL   Creatinine, Ser 1.31 (H) 0.61 - 1.24 mg/dL   Calcium 8.6 (L) 8.9 - 10.3 mg/dL   Total Protein 6.1 (L) 6.5 - 8.1 g/dL   Albumin 2.7 (L) 3.5 - 5.0 g/dL   AST 26 15 - 41 U/L   ALT <5 (L) 17 - 63 U/L   Alkaline Phosphatase 43 38 - 126 U/L   Total Bilirubin 0.8 0.3 - 1.2 mg/dL   GFR calc non Af Amer 48 (L) >60 mL/min   GFR calc Af Amer 56 (L) >60 mL/min    Comment: (NOTE) The eGFR has been calculated using the CKD EPI equation. This calculation has not been validated in all clinical situations. eGFR's persistently <60 mL/min signify possible Chronic Kidney Disease.    Anion gap 9 5 - 15  Urinalysis, Routine w reflex microscopic (not at [facility])     Status: Abnormal   Collection Time: 09/25/15  1:14 PM  Result Value Ref Range   Color, Urine AMBER (A) YELLOW    Comment: BIOCHEMICALS MAY BE AFFECTED BY COLOR   APPearance CLEAR CLEAR   Specific Gravity, Urine 1.028 1.005 - 1.030   pH 5.5 5.0 - 8.0   Glucose, UA NEGATIVE NEGATIVE mg/dL   Hgb urine dipstick NEGATIVE NEGATIVE   Bilirubin Urine SMALL (A) NEGATIVE   Ketones, ur 15 (A) NEGATIVE mg/dL   Protein, ur 100 (A) NEGATIVE mg/dL   Nitrite NEGATIVE NEGATIVE   Leukocytes, UA NEGATIVE NEGATIVE  Urine microscopic-add on     Status: Abnormal   Collection Time: 09/25/15  1:14 PM  Result Value Ref Range   Squamous Epithelial / LPF 0-5 (A) NONE SEEN   WBC, UA 0-5 0 - 5 WBC/hpf   RBC / HPF NONE  SEEN 0 - 5 RBC/hpf    Bacteria, UA FEW (A) NONE SEEN   Casts GRANULAR CAST (A) NEGATIVE    Comment: HYALINE CASTS   Urine-Other MUCOUS PRESENT   CBC     Status: Abnormal   Collection Time: 09/26/15  6:50 AM  Result Value Ref Range   WBC 3.8 (L) 4.0 - 10.5 K/uL   RBC 2.76 (L) 4.22 - 5.81 MIL/uL   Hemoglobin 8.3 (L) 13.0 - 17.0 g/dL   HCT 25.3 (L) 39.0 - 52.0 %   MCV 91.7 78.0 - 100.0 fL   MCH 30.1 26.0 - 34.0 pg   MCHC 32.8 30.0 - 36.0 g/dL   RDW 13.7 11.5 - 15.5 %   Platelets 114 (L) 150 - 400 K/uL    Comment: CONSISTENT WITH PREVIOUS RESULT  Comprehensive metabolic panel     Status: Abnormal   Collection Time: 09/26/15  6:50 AM  Result Value Ref Range   Sodium 142 135 - 145 mmol/L   Potassium 3.5 3.5 - 5.1 mmol/L   Chloride 106 101 - 111 mmol/L   CO2 29 22 - 32 mmol/L   Glucose, Bld 103 (H) 65 - 99 mg/dL   BUN 18 6 - 20 mg/dL   Creatinine, Ser 1.20 0.61 - 1.24 mg/dL   Calcium 8.5 (L) 8.9 - 10.3 mg/dL   Total Protein 5.6 (L) 6.5 - 8.1 g/dL   Albumin 2.4 (L) 3.5 - 5.0 g/dL   AST 19 15 - 41 U/L   ALT <5 (L) 17 - 63 U/L   Alkaline Phosphatase 36 (L) 38 - 126 U/L   Total Bilirubin 0.7 0.3 - 1.2 mg/dL   GFR calc non Af Amer 54 (L) >60 mL/min   GFR calc Af Amer >60 >60 mL/min    Comment: (NOTE) The eGFR has been calculated using the CKD EPI equation. This calculation has not been validated in all clinical situations. eGFR's persistently <60 mL/min signify possible Chronic Kidney Disease.    Anion gap 7 5 - 15     Lipid Panel  No results found for: CHOL, TRIG, HDL, CHOLHDL, VLDL, LDLCALC, LDLDIRECT   No results found for: HGBA1C   Lab Results  Component Value Date   CREATININE 1.20 09/26/2015     HPI :82 y.o. male with PMH of Parkinson's disease, chronic atrial fibrillation, chronic kidney disease stage III, and hypertension who presents to the ED with left hip pain following a ground-level mechanical fall at home. Patient reports being in his usual state of health with no recent fever,  chills, or illness. He was ambulating about his house when he stumbled, falling onto his left side. He denies any preceding chest pain, palpitations, lightheadedness, or syncope/presyncope. There was no head strike or loss of consciousness. Patient attributes the fall to Parkinson disease and associated gait disorder. There was immediate pain at the left hip upon falling and EMS was activated for transport to the hospital. Patient was noted to be stable in the field and there was no gross deformity identified by EMS.  In ED, patient was found to be afebrile, saturating well on room air, and with vital signs stable. Radiographs of the left hip depict a comminuted proximal left femur fracture. Blood work is notable for serum creatinine of 1.42, slightly up from his apparent baseline. EKG features atrial fibrillation with ventricular premature complexes. Urinalysis has trace hemoglobin, but is otherwise unremarkable. Orthopedic surgery was consulted from the emergency department and Dr. Sharol Given recommended admission to [facility] with tentative plans for surgical intervention by Dr. Ninfa Linden on 09/23/2015. Patient will be admitted to [facility] for ongoing evaluation and management of closed left proximal femur fracture   HOSPITAL COURSE:    Left proximal femur fracture after mechanical fall. Although from a cardiovascular standpoint patient was moderate risk for complication, due to his progressive Parkinson's disease and frailty, his risk of perioperative complication is considerably higher. He is at risk for delirium, dysphagia, aspiration, ileus. He spends more than 90% of his day in a chair or in bed and ambulates only a limited distance. We discussed the possibility of no surgery, however the patient and family decided to proceed with surgery. Status post-ORIF - Continue pain management - Bowel regimen - DVT prophylaxis per orthopedic surgery , recommend aspirin 325 mg for 30 days and then changed to  81 mg a day. No anticoagulation given high fall risk  Low-grade fevers, tachycardia Patient deemed to be moderate risk for aspiration Chest x-ray negative for pneumonia, UA negative Start incentive spirometry, strict aspiration precautions  Dysphagia Patient placed on Dys 1 (puree) and nectar thick liquids. Modified barium swallow completed Chest x-ray negative for aspiration Continue strict aspiration precautions  Parkinson's disease, stable, but patient has considerable slowing, gait instability at baseline  resume Sinemet postoperatively as quickly as possible      Chronic atrial fibrillation,  - CHADS-VASc 4, not on AC d/t recurrent falls  Tachycardic this morning, obtain EKG, IV metoprolol for rate control  CKD stage III, baseline creatinine of about 1.2 - Hold Lasix, minimize nephrotoxins, improved   Essential hypertension, blood pressures initially very elevated secondary to pain - Continue home-dose Norvasc 5 mg qD  - Treat SBP > 170, or DBP >90 with hydralazine 10 mg IVP  ABLA  - Hgb 11.8>10.3 > 9.9 >8.3 - Suspected secondary to CKD  No signs of acute blood loss, follow CBC   Discharge Exam:   Blood pressure 143/76, pulse 88, temperature 98.4 F (36.9 C), temperature source Oral, resp. rate 20, height 5' 6"  (1.676 m), weight 61.372 kg (135 lb 4.8 oz), SpO2 98 %.    General: Frail appearing adult male, decreased facial expression from Parkinson's, slow to answer and rigid, No acute distress  HEENT: NCAT, MMM  Cardiovascular: I RRR, nl S1, S2 no mrg, 2+ pulses, warm extremities  Respiratory: CTAB, no increased WOB  Abdomen: NABS, soft, NT/ND  MSK: Increased tone and decreased bulk, 2+ pulses, less than 2 second capillary refill, able to wiggle toes and ankle of the left extremity. Tender to palpation over the left lateral hip.  Neuro: Cogwheel rigidity, no focal deficits, left leg limited by pain    Follow-up Information    Follow up  with Mcarthur Rossetti, MD. Schedule an appointment as soon as possible for a visit in 2 weeks.   Specialty:  Orthopedic Surgery   Contact information:   [place] [address] [phone]       Follow up with [facility] .   Specialty:  [place] information:   [place] [phone]      Follow up with Mcarthur Rossetti, MD. Schedule an appointment as soon as possible for a visit in 2 weeks.   Specialty:  Orthopedic Surgery   Contact information:   [place] [address] [phone]       Follow up with GATES,ROBERT NEVILL, MD. Schedule an appointment as  soon as possible for a visit in 3 days.   Specialty:  Internal Medicine   Why:  Post hospital follow-up   Contact information:   [address] [place] [address] [phone]       Signed: Reyne Dumas 09/26/2015, 12:02 PM        Time spent >45 mins

## 2015-09-26 NOTE — Progress Notes (Signed)
Patient will DC to: [facility] Anticipated DC date: 09/26/15 Family notified: daughter Transport by: [facility]  CSW signing off.  Cedric Fishman, [place] Social Worker [phone]

## 2015-09-26 NOTE — Clinical Social Work Placement (Signed)
   CLINICAL SOCIAL WORK PLACEMENT  NOTE  Date:  09/26/2015  Patient Details  Name: Kevin Hendrix MRN: [id number] Date of Birth: 1929/04/25  Clinical Social Work is seeking post-discharge placement for this patient at the [place] level of care (*CSW will initial, date and re-position this form in  chart as items are completed):  Yes   Patient/family provided with [place] Work Department's list of facilities offering this level of care within the geographic area requested by the patient (or if unable, by the patient's family).  Yes   Patient/family informed of their freedom to choose among providers that offer the needed level of care, that participate in Medicare, Medicaid or managed care program needed by the patient, have an available bed and are willing to accept the patient.  Yes   Patient/family informed of [name]'s ownership interest in [facility] and [facility], as well as of the fact that they are under no obligation to receive care at these facilities.  PASRR submitted to EDS on 09/24/15     PASRR number received on 09/24/15     Existing PASRR number confirmed on       FL2 transmitted to all facilities in geographic area requested by pt/family on 09/24/15     FL2 transmitted to all facilities within larger geographic area on       Patient informed that his/her managed care company has contracts with or will negotiate with certain facilities, including the following:        Yes   Patient/family informed of bed offers received.  Patient chooses bed at [facility]     Physician recommends and patient chooses bed at      Patient to be transferred to [facility] on 09/26/15.  Patient to be transferred to facility by [facility]     Patient family notified on 09/26/15 of transfer.  Name of family member notified:  daughter     PHYSICIAN       Additional Comment:     _______________________________________________ Benard Halsted, [place] 09/26/2015, 2:24 PM

## 2015-09-26 NOTE — Progress Notes (Signed)
Speech Language Pathology Dysphagia Treatment Patient Details Name: Kevin Hendrix MRN: [id number] DOB: 1929/06/22 Today's Date: 09/26/2015 Time: 1211-1219 SLP Time Calculation (min) (ACUTE ONLY): 8 min  Assessment / Plan / Recommendation Clinical Impression    No overt s/s of penetration/aspiration observed during today's session and no cues needed. Pt, wife and daughter educated re: aspiration precautions and diet recommendation of Dysphagia 1 (puree) diet and nectar thick liquids (no straws), meds crushed in puree. SLP will f/u to determine diet tolerance.    Diet Recommendation    Dysphagia 1 (puree) diet, nectar thick liquids and meds crushed in puree   SLP Plan Continue with current plan of care      Swallowing Goals     General Behavior/Cognition: Alert;Cooperative Patient Positioning: Upright in bed Oral care provided: N/A HPI: 82 y.o. male with h/o Parkinson's disease, chronic atrial fibrillation, CKD stage III and HTN, who presented to ED with L hip pain following ground-level mechanical fall at home. CXR 3/13 limited examination. Lungs clear. MBS completed 09/2010. Results not available.   Oral Cavity - Oral Hygiene     Dysphagia Treatment Family/Caregiver Educated: wife, daughter Treatment Methods: Skilled observation;Compensation strategy training;Patient/caregiver education Patient observed directly with PO's: Yes Type of PO's observed: Nectar-thick liquids;Dysphagia 1 (puree) Feeding: Total assist Liquids provided via: Cup;No straw Oral Phase Signs & Symptoms:  (none) Pharyngeal Phase Signs & Symptoms:  (none) Type of cueing: Verbal Amount of cueing: Minimal   GO     Kevin Hendrix 09/26/2015, 1:55 PM  Titus Mould, Student-SLP

## 2015-09-26 NOTE — Progress Notes (Signed)
UR COMPLETED  

## 2015-09-27 LAB — CULTURE, BLOOD (ROUTINE X 2): CULTURE: NO GROWTH

## 2015-09-29 LAB — CULTURE, BLOOD (ROUTINE X 2)

## 2016-02-17 ENCOUNTER — Encounter: Payer: Self-pay | Admitting: Neurology

## 2016-03-22 ENCOUNTER — Ambulatory Visit: Payer: Medicare Other | Admitting: Neurology

## 2016-04-08 ENCOUNTER — Telehealth: Payer: Self-pay

## 2016-04-08 ENCOUNTER — Ambulatory Visit: Payer: Medicare Other | Admitting: Neurology

## 2016-04-08 NOTE — Telephone Encounter (Signed)
Patient did not show to appt today  

## 2016-04-15 ENCOUNTER — Encounter: Payer: Self-pay | Admitting: Neurology

## 2016-08-17 ENCOUNTER — Encounter (HOSPITAL_COMMUNITY): Payer: Self-pay

## 2016-08-17 ENCOUNTER — Emergency Department (HOSPITAL_COMMUNITY): Payer: Medicare Other

## 2016-08-17 ENCOUNTER — Inpatient Hospital Stay (HOSPITAL_COMMUNITY)
Admission: EM | Admit: 2016-08-17 | Discharge: 2016-09-09 | DRG: 296 | Disposition: E | Payer: Medicare Other | Attending: Emergency Medicine | Admitting: Emergency Medicine

## 2016-08-17 ENCOUNTER — Other Ambulatory Visit: Payer: Self-pay

## 2016-08-17 DIAGNOSIS — D649 Anemia, unspecified: Secondary | ICD-10-CM | POA: Diagnosis present

## 2016-08-17 DIAGNOSIS — R739 Hyperglycemia, unspecified: Secondary | ICD-10-CM | POA: Diagnosis present

## 2016-08-17 DIAGNOSIS — D696 Thrombocytopenia, unspecified: Secondary | ICD-10-CM | POA: Diagnosis present

## 2016-08-17 DIAGNOSIS — J9600 Acute respiratory failure, unspecified whether with hypoxia or hypercapnia: Secondary | ICD-10-CM

## 2016-08-17 DIAGNOSIS — E876 Hypokalemia: Secondary | ICD-10-CM | POA: Diagnosis present

## 2016-08-17 DIAGNOSIS — I469 Cardiac arrest, cause unspecified: Secondary | ICD-10-CM | POA: Diagnosis present

## 2016-08-17 DIAGNOSIS — I10 Essential (primary) hypertension: Secondary | ICD-10-CM | POA: Diagnosis present

## 2016-08-17 DIAGNOSIS — D329 Benign neoplasm of meninges, unspecified: Secondary | ICD-10-CM | POA: Diagnosis present

## 2016-08-17 DIAGNOSIS — H9223 Otorrhagia, bilateral: Secondary | ICD-10-CM | POA: Diagnosis present

## 2016-08-17 DIAGNOSIS — R64 Cachexia: Secondary | ICD-10-CM | POA: Diagnosis present

## 2016-08-17 DIAGNOSIS — J9601 Acute respiratory failure with hypoxia: Secondary | ICD-10-CM | POA: Diagnosis present

## 2016-08-17 DIAGNOSIS — E872 Acidosis: Secondary | ICD-10-CM | POA: Diagnosis present

## 2016-08-17 DIAGNOSIS — G20 Parkinson's disease: Secondary | ICD-10-CM | POA: Diagnosis present

## 2016-08-17 DIAGNOSIS — R197 Diarrhea, unspecified: Secondary | ICD-10-CM | POA: Diagnosis present

## 2016-08-17 DIAGNOSIS — N179 Acute kidney failure, unspecified: Secondary | ICD-10-CM | POA: Diagnosis present

## 2016-08-17 DIAGNOSIS — Z681 Body mass index (BMI) 19 or less, adult: Secondary | ICD-10-CM | POA: Diagnosis not present

## 2016-08-17 DIAGNOSIS — G9341 Metabolic encephalopathy: Secondary | ICD-10-CM | POA: Diagnosis present

## 2016-08-17 DIAGNOSIS — R4020 Unspecified coma: Secondary | ICD-10-CM | POA: Diagnosis present

## 2016-08-17 DIAGNOSIS — I4901 Ventricular fibrillation: Secondary | ICD-10-CM | POA: Diagnosis present

## 2016-08-17 DIAGNOSIS — I4891 Unspecified atrial fibrillation: Secondary | ICD-10-CM | POA: Diagnosis present

## 2016-08-17 DIAGNOSIS — D332 Benign neoplasm of brain, unspecified: Secondary | ICD-10-CM

## 2016-08-17 LAB — I-STAT ARTERIAL BLOOD GAS, ED
ACID-BASE DEFICIT: 8 mmol/L — AB (ref 0.0–2.0)
Acid-base deficit: 11 mmol/L — ABNORMAL HIGH (ref 0.0–2.0)
BICARBONATE: 19.7 mmol/L — AB (ref 20.0–28.0)
BICARBONATE: 14.8 mmol/L — AB (ref 20.0–28.0)
O2 Saturation: 100 %
O2 Saturation: 100 %
PH ART: 7.298 — AB (ref 7.350–7.450)
TCO2: 21 mmol/L (ref 0–100)
TCO2: 16 mmol/L (ref 0–100)
pCO2 arterial: 46.5 mmHg (ref 32.0–48.0)
pCO2 arterial: 30.2 mmHg — ABNORMAL LOW (ref 32.0–48.0)
pH, Arterial: 7.233 — ABNORMAL LOW (ref 7.350–7.450)
pO2, Arterial: 325 mmHg — ABNORMAL HIGH (ref 83.0–108.0)
pO2, Arterial: 254 mmHg — ABNORMAL HIGH (ref 83.0–108.0)

## 2016-08-17 LAB — COMPREHENSIVE METABOLIC PANEL
ALK PHOS: 71 U/L (ref 38–126)
ALT: 20 U/L (ref 17–63)
AST: 270 U/L — ABNORMAL HIGH (ref 15–41)
Albumin: 2.7 g/dL — ABNORMAL LOW (ref 3.5–5.0)
Anion gap: 22 — ABNORMAL HIGH (ref 5–15)
BILIRUBIN TOTAL: 1 mg/dL (ref 0.3–1.2)
BUN: 29 mg/dL — ABNORMAL HIGH (ref 6–20)
CALCIUM: 8.5 mg/dL — AB (ref 8.9–10.3)
CO2: 18 mmol/L — AB (ref 22–32)
CREATININE: 2.23 mg/dL — AB (ref 0.61–1.24)
Chloride: 104 mmol/L (ref 101–111)
GFR, EST AFRICAN AMERICAN: 29 mL/min — AB (ref >60)
GFR, EST NON AFRICAN AMERICAN: 25 mL/min — AB (ref >60)
Glucose, Bld: 217 mg/dL — ABNORMAL HIGH (ref 65–99)
Potassium: 3 mmol/L — ABNORMAL LOW (ref 3.5–5.1)
SODIUM: 144 mmol/L (ref 135–145)
Total Protein: 5.2 g/dL — ABNORMAL LOW (ref 6.5–8.1)

## 2016-08-17 LAB — CBC WITH DIFFERENTIAL/PLATELET
Basophils Absolute: 0 10*3/uL (ref 0.0–0.1)
Basophils Relative: 0 %
Eosinophils Absolute: 0.1 10*3/uL (ref 0.0–0.7)
Eosinophils Relative: 1 %
HEMATOCRIT: 31.9 % — AB (ref 39.0–52.0)
HEMOGLOBIN: 10.3 g/dL — AB (ref 13.0–17.0)
LYMPHS ABS: 3.4 10*3/uL (ref 0.7–4.0)
LYMPHS PCT: 32 %
MCH: 31 pg (ref 26.0–34.0)
MCHC: 32.3 g/dL (ref 30.0–36.0)
MCV: 96.1 fL (ref 78.0–100.0)
Monocytes Absolute: 0.2 10*3/uL (ref 0.1–1.0)
Monocytes Relative: 2 %
NEUTROS ABS: 6.8 10*3/uL (ref 1.7–7.7)
NEUTROS PCT: 65 %
Platelets: 107 10*3/uL — ABNORMAL LOW (ref 150–400)
RBC: 3.32 MIL/uL — AB (ref 4.22–5.81)
RDW: 14.2 % (ref 11.5–15.5)
WBC: 10.5 10*3/uL (ref 4.0–10.5)

## 2016-08-17 LAB — MAGNESIUM: Magnesium: 2.5 mg/dL — ABNORMAL HIGH (ref 1.7–2.4)

## 2016-08-17 LAB — MRSA PCR SCREENING: MRSA by PCR: NEGATIVE

## 2016-08-17 LAB — C DIFFICILE QUICK SCREEN W PCR REFLEX
C DIFFICILE (CDIFF) INTERP: NOT DETECTED
C DIFFICILE (CDIFF) TOXIN: NEGATIVE
C Diff antigen: NEGATIVE

## 2016-08-17 LAB — PROTIME-INR
INR: 1.8
Prothrombin Time: 21.1 seconds — ABNORMAL HIGH (ref 11.4–15.2)

## 2016-08-17 LAB — I-STAT TROPONIN, ED: Troponin i, poc: 0.08 ng/mL (ref 0.00–0.08)

## 2016-08-17 LAB — LACTIC ACID, PLASMA
Lactic Acid, Venous: 9 mmol/L (ref 0.5–1.9)
Lactic Acid, Venous: 10.5 mmol/L (ref 0.5–1.9)

## 2016-08-17 LAB — I-STAT CG4 LACTIC ACID, ED: Lactic Acid, Venous: 10.41 mmol/L (ref 0.5–1.9)

## 2016-08-17 LAB — PHOSPHORUS: Phosphorus: 6.8 mg/dL — ABNORMAL HIGH (ref 2.5–4.6)

## 2016-08-17 MED ORDER — DEXAMETHASONE SODIUM PHOSPHATE 10 MG/ML IJ SOLN
10.0000 mg | Freq: Once | INTRAMUSCULAR | Status: AC
  Administered 2016-08-17: 10 mg via INTRAVENOUS
  Filled 2016-08-17: qty 1

## 2016-08-17 MED ORDER — ORAL CARE MOUTH RINSE
15.0000 mL | OROMUCOSAL | Status: DC
  Administered 2016-08-17 – 2016-08-18 (×3): 15 mL via OROMUCOSAL

## 2016-08-17 MED ORDER — SODIUM CHLORIDE 0.9 % IV BOLUS (SEPSIS)
1000.0000 mL | Freq: Once | INTRAVENOUS | Status: AC
  Administered 2016-08-17: 1000 mL via INTRAVENOUS

## 2016-08-17 MED ORDER — NOREPINEPHRINE BITARTRATE 1 MG/ML IV SOLN
0.0000 ug/min | INTRAVENOUS | Status: DC
  Administered 2016-08-17: 10 ug/min via INTRAVENOUS
  Administered 2016-08-17: 4 ug/min via INTRAVENOUS
  Administered 2016-08-18: 12 ug/min via INTRAVENOUS
  Filled 2016-08-17 (×3): qty 4

## 2016-08-17 MED ORDER — PANTOPRAZOLE SODIUM 40 MG IV SOLR
40.0000 mg | Freq: Every day | INTRAVENOUS | Status: DC
  Administered 2016-08-17: 40 mg via INTRAVENOUS
  Filled 2016-08-17: qty 40

## 2016-08-17 MED ORDER — ACETAMINOPHEN 650 MG RE SUPP: 650.0000 mg | Freq: Four times a day (QID) | RECTAL | Status: DC | PRN

## 2016-08-17 MED ORDER — SODIUM CHLORIDE 0.9 % IV SOLN: 250.0000 mL | INTRAVENOUS | Status: DC | PRN

## 2016-08-17 MED ORDER — DEXTROSE 5 % IV SOLN: 0.0000 ug/min | Freq: Once | INTRAVENOUS | Status: DC

## 2016-08-17 MED ORDER — SODIUM CHLORIDE 0.9 % IV SOLN
30.0000 meq | Freq: Once | INTRAVENOUS | Status: AC
  Administered 2016-08-17: 30 meq via INTRAVENOUS
  Filled 2016-08-17: qty 15

## 2016-08-17 MED ORDER — CHLORHEXIDINE GLUCONATE 0.12% ORAL RINSE (MEDLINE KIT)
15.0000 mL | Freq: Two times a day (BID) | OROMUCOSAL | Status: DC
  Administered 2016-08-17: 15 mL via OROMUCOSAL

## 2016-08-17 NOTE — ED Notes (Addendum)
Pts BP dropped to 57 systolic. BP rechecked and 76/55. Dr. Jeneen Rinks informed. Gave order for Levophed.

## 2016-08-17 NOTE — Progress Notes (Signed)
CRITICAL VALUE ALERT  Critical value received:  Lactic acid 10.5  Date of notification:  08/14/2016  Critical value read back:Yes.    Called eLink and spoke with RN who will report lab value to MD.

## 2016-08-17 NOTE — Progress Notes (Signed)
eLink Physician-Brief Progress Note Patient Name: Kevin Hendrix DOB: [birth year] MRN: [id number]   Date of Service  08/19/2016  HPI/Events of Note  Admitted to PCCM service after cardiac arrest. RN reports severe watery diarrhea. No recent hospitalizations or antibiotics reported. Nonetheless, will order C diff  eICU Interventions  C diff PCR ordered     Intervention Category Evaluation Type: New Patient Evaluation  Wilhelmina Mcardle 08/17/2016, 4:20 PM

## 2016-08-17 NOTE — ED Provider Notes (Addendum)
[facility] DEPT Provider Note   CSN: [id number] Arrival date & time: 08/31/2016  0701     History   Chief Complaint Chief Complaint  Patient presents with  . Cardiac Arrest    HPI Kevin Hendrix is a 83 y.o. male. Chief complaint is cardiac arrest  HPI:  83 year old male from home. He was in his normal state of health at 03 100 when retiring to bed per his wife's report. She states that they sleep in 20 bed's. These were described as hospital beds by paramedics. At approximately 5:30 the wife awakened. She noted that he was draped over the side of his bed rail and was not responding and there were was blood coming from both of his ears. She denied any knowledge of a recent fall or trauma for him. He was unresponsive to her. She contacted 911.  Upon arrival paramedics the patient was noted to be pulseless and apneic. He was bag ventilated and a King airway placed. Initial rhythm was asystole. IV was established via IO in the patient's left leg. Was given multiple doses of epinephrine and atropine and CPR was initiated. At 06 19 he had return of spontaneous circulation with a rhythm of atrial fibrillation. In the interval he went from systole, to PDA with narrow complex, 2 ventricular fibrillation. He was defibrillated and given 300 of amiodarone and then had ROSC.  Upon arrival he is perfusing with nature fibrillation rhythm. Blood pressure of 90. King airway. GCS 3  Patient has history of Parkinson's. Has history of atrial fibrillation. Has history of hypertension. Does not take anticoagulation per family.  Past Medical History:  Diagnosis Date  . Hypertension   . Parkinson disease ([place])     There are no active problems to display for this patient.   No past surgical history on file.     Home Medications    Prior to Admission medications   Not on File    Family History No family history on file.  Social History Social History  Substance Use Topics  . Smoking  status: Not on file  . Smokeless tobacco: Not on file  . Alcohol use Not on file     Allergies   Patient has no allergy information on record.   Review of Systems Review of Systems  Unable to perform ROS: Acuity of condition     Physical Exam Updated Vital Signs BP (!) 90/51   Pulse (!) 58   Temp 98.1 F (36.7 C) (Rectal)   Resp 18   SpO2 100%   Physical Exam  Constitutional: No distress.  Thin, cachectic  HENT:  Blood in both ear canals. Unable to visualize TM  Eyes: Conjunctivae are normal. Pupils are equal, round, and reactive to light. No scleral icterus.  Neck: Normal range of motion. Neck supple. No thyromegaly present.  Cardiovascular: Exam reveals no gallop and no friction rub.   No murmur heard. A. fib rate of 90 on the monitor  Pulmonary/Chest: No respiratory distress. He has no wheezes. He has no rales.  Diagnosis his ventilations via [facility] airway. Rhonchorous bilateral breath sounds.  Abdominal: Soft. He exhibits no distension. There is no tenderness. There is no rebound.  Skin: Skin is warm and dry. No rash noted.     ED Treatments / Results  Labs (all labs ordered are listed, but only abnormal results are displayed) Labs Reviewed  CBC WITH DIFFERENTIAL/PLATELET - Abnormal; Notable for the following:       Result Value  RBC 3.32 (*)    Hemoglobin 10.3 (*)    HCT 31.9 (*)    Platelets 107 (*)    All other components within normal limits  COMPREHENSIVE METABOLIC PANEL - Abnormal; Notable for the following:    Potassium 3.0 (*)    CO2 18 (*)    Glucose, Bld 217 (*)    BUN 29 (*)    Creatinine, Ser 2.23 (*)    Calcium 8.5 (*)    Total Protein 5.2 (*)    Albumin 2.7 (*)    AST 270 (*)    GFR calc non Af Amer 25 (*)    GFR calc Af Amer 29 (*)    Anion gap 22 (*)    All other components within normal limits  PROTIME-INR - Abnormal; Notable for the following:    Prothrombin Time 21.1 (*)    All other components within normal limits  I-STAT  ARTERIAL BLOOD GAS, ED - Abnormal; Notable for the following:    pH, Arterial 7.233 (*)    pO2, Arterial 325.0 (*)    Bicarbonate 19.7 (*)    Acid-base deficit 8.0 (*)    All other components within normal limits  I-STAT CG4 LACTIC ACID, ED - Abnormal; Notable for the following:    Lactic Acid, Venous 10.41 (*)    All other components within normal limits  I-STAT TROPOININ, ED    EKG  EKG Interpretation None       Radiology Ct Head Wo Contrast  Result Date: 08/21/2016 CLINICAL DATA:  Found slumped in bed, bleeding from the ears.  CPR. EXAM: CT HEAD WITHOUT CONTRAST CT CERVICAL SPINE WITHOUT CONTRAST TECHNIQUE: Multidetector CT imaging of the head and cervical spine was performed following the standard protocol without intravenous contrast. Multiplanar CT image reconstructions of the cervical spine were also generated. COMPARISON:  None. FINDINGS: CT HEAD FINDINGS Brain: The brain shows mild age related atrophy. No abnormality seen affecting the brainstem or cerebellum. 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 This is mass-effect upon both inferior frontal lobes frontal lobe edema is present bilaterally. There is no evidence of ischemic infarction. No hydrocephalus. No extra-axial fluid collection. Vascular: There is atherosclerotic calcification of the major vessels at the base of the brain. Skull: No skull fracture.  No focal lesion. Sinuses/Orbits: Mild sinus mucosal thickening, probably subclinical. No sinus fluid. No fluid in the middle ears. Both external auditory canals show soft tissue thickening. Mastoids are clear. Other: None significant CT CERVICAL SPINE FINDINGS Alignment: Normal except for 2 mm retrolisthesis C3-4 appeared Skull base and vertebrae: No evidence of fracture or primary bone lesion. Soft tissues and spinal canal: Spinal canal negative. Soft tissues of the neck are unremarkable except  for carotid atherosclerosis. Endotracheal tube and nasogastric tube in place. Disc levels: Degenerative spondylosis with endplate osteophytes most notable at C3-4, C4-5 and C6-7. Mild foraminal narrowing at C3-4. No advanced degenerative disease. Upper chest: Probable scarring at the apices. Areas of airspace passes the bilaterally which could be developing edema, pneumonia or aspiration. Other: None significant IMPRESSION: Head CT: Meningioma of the planum sphenoidale measuring 5.1 x 2.9 x 4.3 cm with mass effect upon the frontal lobes and frontal lobe edema. No other intracranial finding of note. No evidence of skull or skullbase fracture. Soft tissue swelling in the external auditory canals, nonspecific. Cervical spine CT: No acute or traumatic finding. Ordinary degenerative changes as  outlined above. Patchy airspace density in both lungs that could be edema, pneumonia or aspiration. Electronically Signed   By: Nelson Chimes M.D.   On: 08/25/2016 07:58   Ct Cervical Spine Wo Contrast  Result Date: 08/16/2016 CLINICAL DATA:  Found slumped in bed, bleeding from the ears.  CPR. EXAM: CT HEAD WITHOUT CONTRAST CT CERVICAL SPINE WITHOUT CONTRAST TECHNIQUE: Multidetector CT imaging of the head and cervical spine was performed following the standard protocol without intravenous contrast. Multiplanar CT image reconstructions of the cervical spine were also generated. COMPARISON:  None. FINDINGS: CT HEAD FINDINGS Brain: The brain shows mild age related atrophy. No abnormality seen affecting the brainstem or cerebellum. 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 This is mass-effect upon both inferior frontal lobes frontal lobe edema is present bilaterally. There is no evidence of ischemic infarction. No hydrocephalus. No extra-axial fluid collection. Vascular: There is atherosclerotic calcification of the major vessels at the base of the  brain. Skull: No skull fracture.  No focal lesion. Sinuses/Orbits: Mild sinus mucosal thickening, probably subclinical. No sinus fluid. No fluid in the middle ears. Both external auditory canals show soft tissue thickening. Mastoids are clear. Other: None significant CT CERVICAL SPINE FINDINGS Alignment: Normal except for 2 mm retrolisthesis C3-4 appeared Skull base and vertebrae: No evidence of fracture or primary bone lesion. Soft tissues and spinal canal: Spinal canal negative. Soft tissues of the neck are unremarkable except for carotid atherosclerosis. Endotracheal tube and nasogastric tube in place. Disc levels: Degenerative spondylosis with endplate osteophytes most notable at C3-4, C4-5 and C6-7. Mild foraminal narrowing at C3-4. No advanced degenerative disease. Upper chest: Probable scarring at the apices. Areas of airspace passes the bilaterally which could be developing edema, pneumonia or aspiration. Other: None significant IMPRESSION: Head CT: Meningioma of the planum sphenoidale measuring 5.1 x 2.9 x 4.3 cm with mass effect upon the frontal lobes and frontal lobe edema. No other intracranial finding of note. No evidence of skull or skullbase fracture. Soft tissue swelling in the external auditory canals, nonspecific. Cervical spine CT: No acute or traumatic finding. Ordinary degenerative changes as outlined above. Patchy airspace density in both lungs that could be edema, pneumonia or aspiration. Electronically Signed   By: Nelson Chimes M.D.   On: 09/02/2016 07:58   Dg Chest Portable 1 View  Result Date: 08/20/2016 CLINICAL DATA:  Patient found down and pulseless last night. EXAM: PORTABLE CHEST 1 VIEW COMPARISON:  None. FINDINGS: Endotracheal tube is in place with the tip in good position at the level of the clavicular heads. NG tube is in good position with the side-port in the stomach. Mild left basilar atelectasis is seen. No pneumothorax or pleural effusion. Heart size is normal. Remote right  rib fractures are identified. IMPRESSION: ETT and NG tube in good position. Left basilar atelectasis. Electronically Signed   By: Inge Rise M.D.   On: 09/05/2016 07:31    Procedures Procedures (including critical care time)  Medications Ordered in ED Medications  sodium chloride 0.9 % bolus 1,000 mL (0 mLs Intravenous Stopped 08/24/2016 0756)  dexamethasone (DECADRON) injection 10 mg (10 mg Intravenous Given 08/24/2016 [id number])     Initial Impression / Assessment and Plan / ED Course  I have reviewed the triage vital signs and the nursing notes.  Pertinent labs & imaging results that were available during my care of the patient were reviewed by me  and considered in my medical decision making (see chart for details).     Patient presents status post prehospital cardiac arrest with downtime of 34 minutes during ALS care. Unknown time of arrest prior to this is he was simply found down by wife in bed.  Upon arrival [facility] airway was removed and patient was reintubated by myself.  INTUBATION Performed by: Lolita Patella  Required items: required blood products, implants, devices, and special equipment available Patient identity confirmed: provided demographic data and hospital-assigned identification number Time out: Immediately prior to procedure a "time out" was called to verify the correct patient, procedure, equipment, support staff and site/side marked as required.  Indications: Cardiac arrest  Intubation method: +Glidescope Laryngoscopy   Preoxygenation: +King airway  Sedatives: None Paralytic: None  Tube Size: 8-0+ cuffed  Post-procedure assessment: chest rise and ETCO2 monitor Breath sounds: equal and absent over the epigastrium Tube secured with: ETT holder Chest x-ray interpreted by radiologist and me.  Chest x-ray findings: +endotracheal tube in appropriate position  Patient tolerated the procedure well with no immediate complications.  His pressure  deteriorated was given fluid bolus 1500 total. CT shows a large frontal meningioma with frontal edema. Given Decadron 10 mg IV.  History discussed with critical care team. They're en route for evaluation.  The patient's wife and 2 daughters. I discussed with him the dire nature of his condition. Express them that there is minimal chance for survival with minimum of 34 minutes of prehospital cardiac arrest. They expressed understanding of this.     Final Clinical Impressions(s) / ED Diagnoses   Final diagnoses:  Cardiac arrest ([place])  Benign neoplasm of brain, unspecified brain region [facility])    Blood pressure deteriorated despite IV fluids. Started on Levophed. Will titrate to a map of 65.  CRITICAL CARE Performed by: Tanna Furry JOSEPH   Total critical care time: 60 minutes  Critical care time was exclusive of separately billable procedures and treating other patients.  Critical care was necessary to treat or prevent imminent or life-threatening deterioration.  Critical care was time spent personally by me on the following activities: development of treatment plan with patient and/or surrogate as well as nursing, discussions with consultants, evaluation of patient's response to treatment, examination of patient, obtaining history from patient or surrogate, ordering and performing treatments and interventions, ordering and review of laboratory studies, ordering and review of radiographic studies, pulse oximetry and re-evaluation of patient's condition.   New Prescriptions New Prescriptions   No medications on file     Tanna Furry, MD 08/31/2016 [id number]    Tanna Furry, MD 08/27/2016 [phone]

## 2016-08-17 NOTE — H&P (Addendum)
PULMONARY / CRITICAL CARE MEDICINE   Name: Kevin Hendrix MRN: [id number] DOB: 1929-04-03    ADMISSION DATE:  08/21/2016 CONSULTATION DATE:  08/19/2016  REFERRING MD:  Dr. Jeneen Rinks   CHIEF COMPLAINT:  Cardiac Arrest   HISTORY OF PRESENT ILLNESS:   83 y/o M admitted 2/6 after suffering a cardiac arrest.    Wife reports they share a bedroom > two twin beds.  They went to bed around 0300 and she woke at 0530 noting he was draped over the side of the bed.  EMS was activated and arrived around 0545 with initial rhythm of asystole to PEA > VF to AF .  EMS performed ACLS for approximately 34 minutes total.  Unknown downtime prior to finding patient altered.  King airway placed and patient transported to ER.     In ER, the airway was exchanged.  Initial GCS of 3. He was treated with 1500 ml of IVF's and decadron.  He was noted to have bleeding from bilateral ears.  ABG 7.233 / CO2 46 / 325 / 19.  Lactic acid 10.41, troponin 0.08, WBC 10.5, hgb 10.3 and platelets 107.  CT of the head demonstrated known meningioma with edema in the frontal lobe. CXR showed left basilar atelectasis.   At baseline wife reports his life is greatly affected by Parkinsons and he is really unable to perform most ADLs on his own.    PAST MEDICAL HISTORY :  He  has a past medical history of Hypertension and Parkinson disease ([place]).  PAST SURGICAL HISTORY: He  has no past surgical history on file.  Allergies not on file  No current facility-administered medications on file prior to encounter.    No current outpatient prescriptions on file prior to encounter.    FAMILY HISTORY:  His has no family status information on file.    SOCIAL HISTORY: He    REVIEW OF SYSTEMS:  Unable to complete as patient is altered on mechanical ventilation.  SUBJECTIVE:    VITAL SIGNS: BP 113/71   Pulse 75   Temp 98.1 F (36.7 C) (Rectal)   Resp 18   SpO2 100%   HEMODYNAMICS:    VENTILATOR SETTINGS: Vent Mode: PRVC FiO2  (%):  [100 %] 100 % Set Rate:  [18 bmp] 18 bmp Vt Set:  [550 mL] 550 mL PEEP:  [5 cmH20] 5 cmH20 Plateau Pressure:  [18 cmH20] 18 cmH20  INTAKE / OUTPUT: No intake/output data recorded.   PHYSICAL EXAMINATION: General:  Frail elderly male in NAD Neuro:  Comatose on vent. R pupil 54mm unreactive, L pupil 12mm and unreactive.  HEENT:  [place]/AT, some blood from ear canals, no JVD Cardiovascular:  IRIR, no JVD Lungs:  Clear bilateral breath sounds Abdomen:  Soft, non-distended, hypoactive bowel sounds. Musculoskeletal:  No acute deformity Skin:  Grossly intact  LABS:  BMET No results for input(s): NA, K, CL, CO2, BUN, CREATININE, GLUCOSE in the last 168 hours.  Electrolytes No results for input(s): CALCIUM, MG, PHOS in the last 168 hours.  CBC  Recent Labs Lab 08/13/2016 0712  WBC 10.5  HGB 10.3*  HCT 31.9*  PLT 107*    Coag's  Recent Labs Lab 08/22/2016 0712  INR 1.80    Sepsis Markers  Recent Labs Lab 08/19/2016 0726  LATICACIDVEN 10.41*    ABG  Recent Labs Lab 09/03/2016 0718  PHART 7.233*  PCO2ART 46.5  PO2ART 325.0*    Liver Enzymes No results for input(s): AST, ALT, ALKPHOS, BILITOT, ALBUMIN in the last  168 hours.  Cardiac Enzymes No results for input(s): TROPONINI, PROBNP in the last 168 hours.  Glucose No results for input(s): GLUCAP in the last 168 hours.  Imaging Ct Head Wo Contrast  Result Date: 08/13/2016 CLINICAL DATA:  Found slumped in bed, bleeding from the ears.  CPR. EXAM: CT HEAD WITHOUT CONTRAST CT CERVICAL SPINE WITHOUT CONTRAST TECHNIQUE: Multidetector CT imaging of the head and cervical spine was performed following the standard protocol without intravenous contrast. Multiplanar CT image reconstructions of the cervical spine were also generated. COMPARISON:  None. FINDINGS: CT HEAD FINDINGS Brain: The brain shows mild age related atrophy. No abnormality seen affecting the brainstem or cerebellum. There is a meningioma of the planum  sphenoidale measuring 5.1 cm front to back, 2.9 cm cephalo caudal and 4.3 cm right to left. This is mass-effect upon both inferior frontal lobes frontal lobe edema is present bilaterally. There is no evidence of ischemic infarction. No hydrocephalus. No extra-axial fluid collection. Vascular: There is atherosclerotic calcification of the major vessels at the base of the brain. Skull: No skull fracture.  No focal lesion. Sinuses/Orbits: Mild sinus mucosal thickening, probably subclinical. No sinus fluid. No fluid in the middle ears. Both external auditory canals show soft tissue thickening. Mastoids are clear. Other: None significant CT CERVICAL SPINE FINDINGS Alignment: Normal except for 2 mm retrolisthesis C3-4 appeared Skull base and vertebrae: No evidence of fracture or primary bone lesion. Soft tissues and spinal canal: Spinal canal negative. Soft tissues of the neck are unremarkable except for carotid atherosclerosis. Endotracheal tube and nasogastric tube in place. Disc levels: Degenerative spondylosis with endplate osteophytes most notable at C3-4, C4-5 and C6-7. Mild foraminal narrowing at C3-4. No advanced degenerative disease. Upper chest: Probable scarring at the apices. Areas of airspace passes the bilaterally which could be developing edema, pneumonia or aspiration. Other: None significant IMPRESSION: Head CT: Meningioma of the planum sphenoidale measuring 5.1 x 2.9 x 4.3 cm with mass effect upon the frontal lobes and frontal lobe edema. No other intracranial finding of note. No evidence of skull or skullbase fracture. Soft tissue swelling in the external auditory canals, nonspecific. Cervical spine CT: No acute or traumatic finding. Ordinary degenerative changes as outlined above. Patchy airspace density in both lungs that could be edema, pneumonia or aspiration. Electronically Signed   By: Nelson Chimes M.D.   On: 09/04/2016 07:58   Ct Cervical Spine Wo Contrast  Result Date: 08/17/2016 CLINICAL  DATA:  Found slumped in bed, bleeding from the ears.  CPR. EXAM: CT HEAD WITHOUT CONTRAST CT CERVICAL SPINE WITHOUT CONTRAST TECHNIQUE: Multidetector CT imaging of the head and cervical spine was performed following the standard protocol without intravenous contrast. Multiplanar CT image reconstructions of the cervical spine were also generated. COMPARISON:  None. FINDINGS: CT HEAD FINDINGS Brain: The brain shows mild age related atrophy. No abnormality seen affecting the brainstem or cerebellum. 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 This is mass-effect upon both inferior frontal lobes frontal lobe edema is present bilaterally. There is no evidence of ischemic infarction. No hydrocephalus. No extra-axial fluid collection. Vascular: There is atherosclerotic calcification of the major vessels at the base of the brain. Skull: No skull fracture.  No focal lesion. Sinuses/Orbits: Mild sinus mucosal thickening, probably subclinical. No sinus fluid. No fluid in the middle ears. Both external auditory canals show soft tissue thickening. Mastoids are clear. Other: None significant CT CERVICAL  SPINE FINDINGS Alignment: Normal except for 2 mm retrolisthesis C3-4 appeared Skull base and vertebrae: No evidence of fracture or primary bone lesion. Soft tissues and spinal canal: Spinal canal negative. Soft tissues of the neck are unremarkable except for carotid atherosclerosis. Endotracheal tube and nasogastric tube in place. Disc levels: Degenerative spondylosis with endplate osteophytes most notable at C3-4, C4-5 and C6-7. Mild foraminal narrowing at C3-4. No advanced degenerative disease. Upper chest: Probable scarring at the apices. Areas of airspace passes the bilaterally which could be developing edema, pneumonia or aspiration. Other: None significant IMPRESSION: Head CT: Meningioma of the planum sphenoidale measuring 5.1 x 2.9 x 4.3 cm  with mass effect upon the frontal lobes and frontal lobe edema. No other intracranial finding of note. No evidence of skull or skullbase fracture. Soft tissue swelling in the external auditory canals, nonspecific. Cervical spine CT: No acute or traumatic finding. Ordinary degenerative changes as outlined above. Patchy airspace density in both lungs that could be edema, pneumonia or aspiration. Electronically Signed   By: Nelson Chimes M.D.   On: 09/03/2016 07:58   Dg Chest Portable 1 View  Result Date: 08/12/2016 CLINICAL DATA:  Patient found down and pulseless last night. EXAM: PORTABLE CHEST 1 VIEW COMPARISON:  None. FINDINGS: Endotracheal tube is in place with the tip in good position at the level of the clavicular heads. NG tube is in good position with the side-port in the stomach. Mild left basilar atelectasis is seen. No pneumothorax or pleural effusion. Heart size is normal. Remote right rib fractures are identified. IMPRESSION: ETT and NG tube in good position. Left basilar atelectasis. Electronically Signed   By: Inge Rise M.D.   On: 08/14/2016 07:31     STUDIES:  CT Head 2/6 >> meningioma of planum sphenoidale 5.1x2.9.4.3 cm with mass effect on the frontal lobes with frontal lobe edema CT Cervical Spine 2/6 >> no acute findings  CULTURES:   ANTIBIOTICS:   SIGNIFICANT EVENTS: 2/06  Admit post asystolic arrest  LINES/TUBES: ETT 2/6 >>>  DISCUSSION:   ASSESSMENT / PLAN:  PULMONARY A: Acute hypoxemic respiratory failure secondary to cardiac arrest  P:   Full vent support ABG follow VAP prevention bundle  CARDIOVASCULAR A:  Cardiac arrest Atrial fibrillation  P:  Telemetry monitoring Trend lactic acid MAP goal > 48mmHg, maintaining with no pressors currently Not a candidate for TTM protocol Avoid fevers Will hold heparin infusion for now as bleeding from ears  RENAL A:   AKI Hypokalemia  P:   Hydrate Follow BMP Supp K  GASTROINTESTINAL A:    Transaminitis, likely shock liver  P:   Trend LFT  HEMATOLOGIC A:   Anemia Thrombocytopenia  P:  Follow CBC SCDs  INFECTIOUS A:   Consider possible aspiration, note L basilar atx on CXR  P:   Start empiric unasyn if fever or decompensation  ENDOCRINE A:   Hyperglycemia without history of DM  P:   CBG monitoring and SSI  NEUROLOGIC A:   Acute metabolic encephalopathy  P:   RASS goal: 0 EEG    FAMILY  - Updates: Family updated in ED. Deciding of code status, will maintain full until decision reached  - Inter-disciplinary family meet or Palliative Care meeting due by:  2/13  APP critical care time: 32 mins  Georgann Housekeeper, AGACNP-BC [employer] [facility]/Critical [facility] Pager [phone] or [phone]  08/24/2016 8:48 AM   Attending Note:  I have examined patient, reviewed labs, studies and notes. I have discussed the case  with Kevin Hendrix, and I agree with the data and plans as amended above.   83 yo man w limited functional capacity due to Parkinson's Disease, also with HTN. He was found unresponsive and apneic, bleeding from his ears, by his wife am 2/6. EMS arrived and found asystole. NOSC after estimated 34 CPR + 15 min down (at least 50 minutes). Unclear how long down before he was found at 5:30am. He was stabilized in the ED with MV. Head Ct scan shows a large meningioma w some mass effect and edema. No fracture noted. On my eval he is completely unresponsive to voice or pain. His pupils are both dilated and unresponsive, the R is slightly larger than the L. Clear lung exam. He has an IO in L shin, B LE edema R > L. CV regular, no M, hemodynamically stable. Given his down time, asystole on rhythm, overall prognosis for neuro recovery I have elected to defer hypothermia. We will avoid fevers, follow MS off sedation to assess for potential recovery. Consider repeat Ct head in 24h given meningioma, bleeding from his ears. There is no evidence acute bleed on his CT  2/6. Will defer abx, start unasyn if fever or decompensation given atx on CXR  I have discussed his status with several family members at bedside in the ED, including his wife. I have explained that his prognosis for a neuro recovery is poor, that he will almost certainly have multiple end-organ injuries. I have recommended that we follow for 24-48 h to see if he wakes up any. If he does not wake up then I have recommended transition to comfort. Further I have recommended that he should not undergo further CPR should he decline. They are thinking about this, will consider making him DNR as we wait to assess neurologically. He is full code at this time, will need to discuss with them more after they have an opportunity to consider.   Independent critical care time is 40 minutes.   Baltazar Apo, MD, PhD 08/12/2016, 10:42 AM [facility] [phone] or if no answer [phone]

## 2016-08-17 NOTE — ED Notes (Signed)
Portable xray at bedside.

## 2016-08-17 NOTE — ED Notes (Signed)
Pt transported to and from CT by this RN, Illene Silver, RN, Cline Cools, EMT and Christy Sartorius from respiratory, without incident, pulses maintained throughout transport and on ventilator and monitored by zoll. Pt brought back to trauma B. Family also brought to bedside.

## 2016-08-17 NOTE — ED Triage Notes (Addendum)
PER EMS: family called EMS at 0545, pt was found by EMS laying on side of bed with his head hanging off, pt pulseless and apneic. Last seen at 3am, bleeding noted from both ears. CPR started on scene at 0554. Pt initially in asystole, then PEA at 120 then pt went into vfib, shocked x 1 with ROSC at 0617. Pulses have been maintained since. Total of 4 epi's given en route and 300 mg of Amiodarone. IO to LEFT tib/fib.

## 2016-08-17 NOTE — Care Management Note (Signed)
Case Management Note  Patient Details  Name: Kevin Hendrix MRN: [id number] Date of Birth: 1928/10/07  Subjective/Objective:     Adm w arrest, vent, has parkinson's              Action/Plan: lives w wife, needs help w adl's   Expected Discharge Date:                  Expected Discharge Plan:     In-House Referral:     Discharge planning Services     Post Acute Care Choice:    Choice offered to:     DME Arranged:    DME Agency:     HH Arranged:    [place] Agency:     Status of Service:  In process, will continue to follow  If discussed at Long Length of Stay Meetings, dates discussed:    Additional Comments:will moniter as pt progresses.  Lacretia Leigh, RN 08/25/2016, 4:06 PM

## 2016-08-18 ENCOUNTER — Inpatient Hospital Stay (HOSPITAL_COMMUNITY): Payer: Medicare Other

## 2016-08-18 LAB — BASIC METABOLIC PANEL
Anion gap: 30 — ABNORMAL HIGH (ref 5–15)
BUN: 42 mg/dL — AB (ref 6–20)
CALCIUM: 8.3 mg/dL — AB (ref 8.9–10.3)
CHLORIDE: 105 mmol/L (ref 101–111)
CO2: 10 mmol/L — AB (ref 22–32)
CREATININE: 3.43 mg/dL — AB (ref 0.61–1.24)
GFR calc non Af Amer: 15 mL/min — ABNORMAL LOW (ref >60)
GFR, EST AFRICAN AMERICAN: 17 mL/min — AB (ref >60)
GLUCOSE: 108 mg/dL — AB (ref 65–99)
Potassium: 4.8 mmol/L (ref 3.5–5.1)
Sodium: 145 mmol/L (ref 135–145)

## 2016-08-18 LAB — CBC
HEMATOCRIT: 35.5 % — AB (ref 39.0–52.0)
HEMOGLOBIN: 11.2 g/dL — AB (ref 13.0–17.0)
MCH: 31.2 pg (ref 26.0–34.0)
MCHC: 31.5 g/dL (ref 30.0–36.0)
MCV: 98.9 fL (ref 78.0–100.0)
Platelets: 120 10*3/uL — ABNORMAL LOW (ref 150–400)
RBC: 3.59 MIL/uL — ABNORMAL LOW (ref 4.22–5.81)
RDW: 15.3 % (ref 11.5–15.5)
WBC: 9.8 10*3/uL (ref 4.0–10.5)

## 2016-08-18 LAB — PHOSPHORUS: Phosphorus: 9.7 mg/dL — ABNORMAL HIGH (ref 2.5–4.6)

## 2016-08-18 LAB — MAGNESIUM: Magnesium: 2.7 mg/dL — ABNORMAL HIGH (ref 1.7–2.4)

## 2016-08-19 ENCOUNTER — Encounter (HOSPITAL_COMMUNITY): Payer: Self-pay | Admitting: Orthopaedic Surgery

## 2016-08-19 MED FILL — Medication: Qty: 1 | Status: AC

## 2016-08-23 ENCOUNTER — Telehealth: Payer: Self-pay

## 2016-08-23 NOTE — Telephone Encounter (Signed)
On 08/23/16  I received a death certificate from [facility] (original). The death certificate is for burial. The patient is a patient of Doctor Byrum. The death certificate will be taken to [facility]  this am. On 08/25/2016 I received the death certificate back from Doctor Byrum. I got the death certificate ready and called the funeral home to let them know the death certificate is ready for pickup.

## 2016-09-09 NOTE — Discharge Summary (Signed)
[facility] Death Note  Date of Death: 09/17/16  Cause of Death: Ventricular Fibrilliation  HPI and Hospital Course: This is an 83 y/o male with a history of Parkinon's disease, CKD and afib was admitted on August 17, 2016 with a cardiac arrest.  He was found by his wife unresponsive and underwent CPR for 34 minutes prior to regaining spontaneous circulation.  He was admitted to the intensive care unit and was cared for with mechanical ventilation and supportive care.  A CT scan was performed which showed a meningioma but no acute abnormality.  Discussion with his family was held regarding code status, but they were unable to reach a decision about making him DNR on admission.  In the evening of 2016-09-17 he suddenly developed bradycardia then asystole.  CPR was initiated in the ICU.  He had intermittent Ventricular fibrillation which was treated with cardioversion and standard ACLS protocol.  However despite this after 10 minutes of CPR he remained pulseless.  We held further resuscitation at that point due to his advanced age, poor prognosis, multiple comorbid illnesses and lack of medical benefit.    Roselie Awkward, MD [place] [facility] Pager: [phone] Cell: [phone] After 3pm or if no response, call [phone]

## 2016-09-09 NOTE — Progress Notes (Addendum)
[facility]  Called emergently to the bedside for cardiac arrest.  On my arrival he was undergoing CPR and I assumed responsibility for resuscitation.  History was obtained by talking to caregivers in the room who informed me that he presented with a cardiac arrest yesterday after CPR for 34 minutes.  Today he has been intubated and had a persistent lactic acidosis.  This evening he developed asystole and the bedside time initiated CPR.    I directed CPR per ACLS protocol.  On my arrival he had Vfib which we shocked with defibrillation and administered epinephrine again.  He received a total of 4 rounds of epinephrine and two defibrillating shocks.  Despite this he never regained a pulse.  After 10 minutes of CPR in the ICU this evening I halted further efforts given his advanced age, comorbid illnesses, and continued pulselessness.  Time of death 38 on 12-Sep-2016.  CCM time 30 minutes  Roselie Awkward, MD [place] [facility] Pager: [phone] Cell: [phone] After 3pm or if no response, call [phone]

## 2016-09-09 NOTE — Progress Notes (Signed)
eLink Physician-Brief Progress Note Patient Name: Kevin Hendrix DOB: 12-Nov-1928 MRN: [id number]   Date of Service  Sep 05, 2016  HPI/Events of Note    eICU Interventions  I called his daughter Kevin Hendrix and grandson Nicole Kindred and informed them about [name]'s death     Intervention Category Minor Interventions: Other:  Marke Goodwyn 2016/09/05, 4:01 AM

## 2016-09-09 DEATH — deceased
